# Patient Record
Sex: Female | Born: 1950 | Race: White | Hispanic: No | Marital: Single
Health system: Southern US, Community
[De-identification: ages and names within clinical notes are randomized; demographics above are authoritative.]

## PROBLEM LIST (undated history)

## (undated) DIAGNOSIS — E039 Hypothyroidism, unspecified: Secondary | ICD-10-CM

## (undated) DIAGNOSIS — H409 Unspecified glaucoma: Secondary | ICD-10-CM

## (undated) HISTORY — PX: HIP SURGERY: SHX245

---

## 2005-10-30 ENCOUNTER — Emergency Department: Payer: Self-pay | Admitting: Emergency Medicine

## 2019-06-04 ENCOUNTER — Other Ambulatory Visit: Payer: Self-pay

## 2019-06-04 ENCOUNTER — Emergency Department
Admission: EM | Admit: 2019-06-04 | Discharge: 2019-06-04 | Disposition: A | Discharge status: Home / Self Care | Payer: Medicare Other | Attending: Emergency Medicine | Admitting: Emergency Medicine

## 2019-06-04 ENCOUNTER — Emergency Department: Payer: Medicare Other

## 2019-06-04 DIAGNOSIS — S0083XA Contusion of other part of head, initial encounter: Secondary | ICD-10-CM | POA: Diagnosis not present

## 2019-06-04 DIAGNOSIS — S0181XA Laceration without foreign body of other part of head, initial encounter: Secondary | ICD-10-CM

## 2019-06-04 DIAGNOSIS — S01511A Laceration without foreign body of lip, initial encounter: Secondary | ICD-10-CM | POA: Diagnosis not present

## 2019-06-04 DIAGNOSIS — Y998 Other external cause status: Secondary | ICD-10-CM | POA: Diagnosis not present

## 2019-06-04 DIAGNOSIS — Y9301 Activity, walking, marching and hiking: Secondary | ICD-10-CM | POA: Insufficient documentation

## 2019-06-04 DIAGNOSIS — Y9289 Other specified places as the place of occurrence of the external cause: Secondary | ICD-10-CM | POA: Insufficient documentation

## 2019-06-04 DIAGNOSIS — E039 Hypothyroidism, unspecified: Secondary | ICD-10-CM | POA: Diagnosis not present

## 2019-06-04 DIAGNOSIS — W19XXXA Unspecified fall, initial encounter: Secondary | ICD-10-CM

## 2019-06-04 DIAGNOSIS — W01198A Fall on same level from slipping, tripping and stumbling with subsequent striking against other object, initial encounter: Secondary | ICD-10-CM | POA: Insufficient documentation

## 2019-06-04 DIAGNOSIS — S0990XA Unspecified injury of head, initial encounter: Secondary | ICD-10-CM | POA: Diagnosis present

## 2019-06-04 HISTORY — DX: Unspecified glaucoma: H40.9

## 2019-06-04 HISTORY — DX: Hypothyroidism, unspecified: E03.9

## 2019-06-04 MED ORDER — TRAMADOL HCL 50 MG PO TABS: 50.0000 mg | ORAL_TABLET | Freq: Four times a day (QID) | ORAL | 0 refills | Status: DC | PRN

## 2019-06-04 MED ORDER — LIDOCAINE-EPINEPHRINE (PF) 2 %-1:200000 IJ SOLN
10.0000 mL | Freq: Once | INTRAMUSCULAR | Status: AC
  Administered 2019-06-04: 10 mL
  Filled 2019-06-04 (×2): qty 10

## 2019-06-04 NOTE — ED Triage Notes (Signed)
Patient arrived via EMS. Patient is from home, AOx4 and ambulatory at baseline. Patient had mechanical fall hitting bottom of chin with a 2-3cm laceration and a 1cm laceration on the right/mid-center lip.   Patient did loose consciousness.  Patient is not on blood thinners.

## 2019-06-04 NOTE — ED Provider Notes (Signed)
[facility] Emergency Department Provider Note  ____________________________________________  Time seen: Approximately 1:22 PM  I have reviewed the triage vital signs and the nursing notes.   HISTORY  Chief Complaint Fall and Facial Laceration (Lip and Chin)    HPI Carly Carter is a 69 y.o. female who presents the emergency department complaining of headache, facial pain, lacerations to the lip and chin after a fall.  Patient states that she was wearing sandals, caught the sandals on an elevated concrete ridge.  This caused patient to fall forward striking her face and head.  She had a brief loss of consciousness after hitting her head.  She states that she is endorsing a small frontal headache, right-sided facial pain including the periorbital region, right maxillary region as well as lacerations to the right lower lip and right chin.  Patient has had no subsequent loss of consciousness.  EMS transports the patient and states that they have not given any kind of medications.  Patient currently denies any visual changes, neck pain, chest pain, shortness of breath, abdominal pain, nausea vomiting.  Patient denies any other musculoskeletal complaints.  No medications prior to arrival.         Past Medical History:  Diagnosis Date  . Glaucoma   . Hypothyroidism     There are no problems to display for this patient.   Past Surgical History:  Procedure Laterality Date  . HIP SURGERY Bilateral     Prior to Admission medications   Medication Sig Start Date End Date Taking? Authorizing Provider  traMADol (ULTRAM) 50 MG tablet Take 1 tablet (50 mg total) by mouth every 6 (six) hours as needed. 06/04/19   Deliliah Spranger, Delorise RoyalsJonathan D, PA-C    Allergies Radish [raphanus sativus] and Other  No family history on file.  Social History Social History   Tobacco Use  . Smoking status: Unknown If Ever Smoked  Substance Use Topics  . Alcohol use: Yes    Comment:  occasional glass of wine per week  . Drug use: Never     Review of Systems  Constitutional: No fever/chills Eyes: No visual changes. No discharge ENT: No upper respiratory complaints. Cardiovascular: no chest pain. Respiratory: no cough. No SOB. Gastrointestinal: No abdominal pain.  No nausea, no vomiting.  No diarrhea.  No constipation. Musculoskeletal: Right-sided facial pain Skin: Laceration to the right lower lip and right chin Neurological: Mild frontal headache but denies focal weakness or numbness. 10-point ROS otherwise negative.  ____________________________________________   PHYSICAL EXAM:  VITAL SIGNS: ED Triage Vitals  Enc Vitals Group     BP      Pulse      Resp      Temp      Temp src      SpO2      Weight      Height      Head Circumference      Peak Flow      Pain Score      Pain Loc      Pain Edu?      Excl. in GC?      Constitutional: Alert and oriented. Well appearing and in no acute distress. Eyes: Conjunctivae are normal. PERRL. EOMI. Head: Patient has ecchymosis, edema in the right periorbital region, right maxillary region.  Right lower lip laceration and right inferior chin laceration.  No gross deformities.  Patient with tenderness along the right inferior orbital rim, right maxillary region, right mandible.  No palpable abnormality  or crepitus.  No subcutaneous emphysema.  Patient has ecchymosis to the right periorbital region but not the left.  No battle signs.  No serosanguineous fluid drainage from the ears or nares. ENT:      Ears:       Nose: No congestion/rhinnorhea.      Mouth/Throat: Mucous membranes are moist.  Right lower lip laceration.  Patient has an visible line to upper and lower dentition.  No loose dentition.  No correct dentition.  No other signs of intraoral trauma. Neck: No stridor.  No cervical spine tenderness to palpation  Cardiovascular: Normal rate, regular rhythm. Normal S1 and S2.  Good peripheral  circulation. Respiratory: Normal respiratory effort without tachypnea or retractions. Lungs CTAB. Good air entry to the bases with no decreased or absent breath sounds. Musculoskeletal: Full range of motion to all extremities. No gross deformities appreciated. Neurologic:  Normal speech and language. No gross focal neurologic deficits are appreciated.  Cranial nerves II through XII grossly intact. Skin:  Skin is warm, dry and intact. No rash noted. Psychiatric: Mood and affect are normal. Speech and behavior are normal. Patient exhibits appropriate insight and judgement.   ____________________________________________   LABS (all labs ordered are listed, but only abnormal results are displayed)  Labs Reviewed - No data to display ____________________________________________  EKG   ____________________________________________  RADIOLOGY I personally viewed and evaluated these images as part of my medical decision making, as well as reviewing the written report by the radiologist.  CT Head Wo Contrast  Result Date: 06/04/2019 CLINICAL DATA:  Recent fall with facial pain and headaches, initial encounter EXAM: CT HEAD WITHOUT CONTRAST CT MAXILLOFACIAL WITHOUT CONTRAST CT CERVICAL SPINE WITHOUT CONTRAST TECHNIQUE: Multidetector CT imaging of the head, cervical spine, and maxillofacial structures were performed using the standard protocol without intravenous contrast. Multiplanar CT image reconstructions of the cervical spine and maxillofacial structures were also generated. COMPARISON:  None. FINDINGS: CT HEAD FINDINGS Brain: No evidence of acute infarction, hemorrhage, hydrocephalus, extra-axial collection or mass lesion/mass effect. Vascular: No hyperdense vessel or unexpected calcification. Skull: Normal. Negative for fracture or focal lesion. Other: None. CT MAXILLOFACIAL FINDINGS Osseous: No acute bony fracture is identified. Some periapical lucencies are noted in the left maxilla consistent  with progressive dental disease. Orbits: Orbits and their contents are unremarkable. Sinuses: Paranasal sinuses are well aerated with the exception of mucosal retention cyst in the inferior aspect of the left maxillary antrum. Soft tissues: Surrounding soft tissue structures demonstrate swelling in the right cheek just below the orbit. CT CERVICAL SPINE FINDINGS Alignment: Mild straightening of the normal cervical lordosis is noted likely related to muscular spasm. Skull base and vertebrae: 7 cervical segments are well visualized. Vertebral body height is well maintained. Mild facet hypertrophic changes are noted. No acute fracture or acute facet abnormality is noted. Partial fusion at C3-4 is noted in the posterior elements on the right. No acute fracture or acute facet abnormality is noted. Soft tissues and spinal canal: Surrounding soft tissue structures are within normal limits. Upper chest: Visualized upper lung fields are within normal limits. Other: None IMPRESSION: CT of the head: No acute intracranial abnormality noted. CT of the maxillofacial bones: No acute bony abnormality is noted. Mild soft tissue swelling in the right cheek consistent with the recent injury. Mucosal retention cyst in the left maxillary antrum. CT of cervical spine: Multilevel degenerative change without acute bony abnormality. Straightening of the normal cervical lordosis likely related to muscular spasm. Electronically Signed   By:  Alcide Clever M.D.   On: 06/04/2019 14:21   CT Cervical Spine Wo Contrast  Result Date: 06/04/2019 CLINICAL DATA:  Recent fall with facial pain and headaches, initial encounter EXAM: CT HEAD WITHOUT CONTRAST CT MAXILLOFACIAL WITHOUT CONTRAST CT CERVICAL SPINE WITHOUT CONTRAST TECHNIQUE: Multidetector CT imaging of the head, cervical spine, and maxillofacial structures were performed using the standard protocol without intravenous contrast. Multiplanar CT image reconstructions of the cervical spine and  maxillofacial structures were also generated. COMPARISON:  None. FINDINGS: CT HEAD FINDINGS Brain: No evidence of acute infarction, hemorrhage, hydrocephalus, extra-axial collection or mass lesion/mass effect. Vascular: No hyperdense vessel or unexpected calcification. Skull: Normal. Negative for fracture or focal lesion. Other: None. CT MAXILLOFACIAL FINDINGS Osseous: No acute bony fracture is identified. Some periapical lucencies are noted in the left maxilla consistent with progressive dental disease. Orbits: Orbits and their contents are unremarkable. Sinuses: Paranasal sinuses are well aerated with the exception of mucosal retention cyst in the inferior aspect of the left maxillary antrum. Soft tissues: Surrounding soft tissue structures demonstrate swelling in the right cheek just below the orbit. CT CERVICAL SPINE FINDINGS Alignment: Mild straightening of the normal cervical lordosis is noted likely related to muscular spasm. Skull base and vertebrae: 7 cervical segments are well visualized. Vertebral body height is well maintained. Mild facet hypertrophic changes are noted. No acute fracture or acute facet abnormality is noted. Partial fusion at C3-4 is noted in the posterior elements on the right. No acute fracture or acute facet abnormality is noted. Soft tissues and spinal canal: Surrounding soft tissue structures are within normal limits. Upper chest: Visualized upper lung fields are within normal limits. Other: None IMPRESSION: CT of the head: No acute intracranial abnormality noted. CT of the maxillofacial bones: No acute bony abnormality is noted. Mild soft tissue swelling in the right cheek consistent with the recent injury. Mucosal retention cyst in the left maxillary antrum. CT of cervical spine: Multilevel degenerative change without acute bony abnormality. Straightening of the normal cervical lordosis likely related to muscular spasm. Electronically Signed   By: Alcide Clever M.D.   On: 06/04/2019  14:21   CT Maxillofacial Wo Contrast  Result Date: 06/04/2019 CLINICAL DATA:  Recent fall with facial pain and headaches, initial encounter EXAM: CT HEAD WITHOUT CONTRAST CT MAXILLOFACIAL WITHOUT CONTRAST CT CERVICAL SPINE WITHOUT CONTRAST TECHNIQUE: Multidetector CT imaging of the head, cervical spine, and maxillofacial structures were performed using the standard protocol without intravenous contrast. Multiplanar CT image reconstructions of the cervical spine and maxillofacial structures were also generated. COMPARISON:  None. FINDINGS: CT HEAD FINDINGS Brain: No evidence of acute infarction, hemorrhage, hydrocephalus, extra-axial collection or mass lesion/mass effect. Vascular: No hyperdense vessel or unexpected calcification. Skull: Normal. Negative for fracture or focal lesion. Other: None. CT MAXILLOFACIAL FINDINGS Osseous: No acute bony fracture is identified. Some periapical lucencies are noted in the left maxilla consistent with progressive dental disease. Orbits: Orbits and their contents are unremarkable. Sinuses: Paranasal sinuses are well aerated with the exception of mucosal retention cyst in the inferior aspect of the left maxillary antrum. Soft tissues: Surrounding soft tissue structures demonstrate swelling in the right cheek just below the orbit. CT CERVICAL SPINE FINDINGS Alignment: Mild straightening of the normal cervical lordosis is noted likely related to muscular spasm. Skull base and vertebrae: 7 cervical segments are well visualized. Vertebral body height is well maintained. Mild facet hypertrophic changes are noted. No acute fracture or acute facet abnormality is noted. Partial fusion at C3-4 is noted in  the posterior elements on the right. No acute fracture or acute facet abnormality is noted. Soft tissues and spinal canal: Surrounding soft tissue structures are within normal limits. Upper chest: Visualized upper lung fields are within normal limits. Other: None IMPRESSION: CT of the  head: No acute intracranial abnormality noted. CT of the maxillofacial bones: No acute bony abnormality is noted. Mild soft tissue swelling in the right cheek consistent with the recent injury. Mucosal retention cyst in the left maxillary antrum. CT of cervical spine: Multilevel degenerative change without acute bony abnormality. Straightening of the normal cervical lordosis likely related to muscular spasm. Electronically Signed   By: Alcide Clever M.D.   On: 06/04/2019 14:21    ____________________________________________    PROCEDURES  Procedure(s) performed:    Marland KitchenMarland KitchenLaceration Repair  Date/Time: 06/04/2019 2:41 PM Performed by: Racheal Patches, PA-C Authorized by: Racheal Patches, PA-C   Consent:    Consent obtained:  Verbal   Consent given by:  Patient   Risks discussed:  Pain Anesthesia (see MAR for exact dosages):    Anesthesia method:  Local infiltration   Local anesthetic:  Lidocaine 2% WITH epi Laceration details:    Location:  Lip   Lip location:  Lower interior lip   Length (cm):  1.5 Repair type:    Repair type:  Simple Exploration:    Hemostasis achieved with:  Epinephrine   Wound extent: no foreign bodies/material noted and no underlying fracture noted     Contaminated: no   Treatment:    Area cleansed with:  Betadine and saline   Amount of cleaning:  Standard   Irrigation solution:  Sterile saline Skin repair:    Repair method:  Sutures   Suture size:  5-0   Wound skin closure material used: monocryl.   Suture technique:  Simple interrupted   Number of sutures:  4 Approximation:    Approximation:  Close Post-procedure details:    Dressing:  Open (no dressing)   Patient tolerance of procedure:  Tolerated well, no immediate complications  .Marland KitchenLaceration Repair  Date/Time: 06/04/2019 2:44 PM Performed by: Racheal Patches, PA-C Authorized by: Racheal Patches, PA-C   Consent:    Consent obtained:  Verbal   Consent given by:   Patient   Risks discussed:  Pain and poor cosmetic result   Alternatives discussed:  No treatment Anesthesia (see MAR for exact dosages):    Anesthesia method:  Local infiltration   Local anesthetic:  Lidocaine 2% WITH epi Laceration details:    Location:  Face   Face location:  Chin   Length (cm):  3 Repair type:    Repair type:  Simple Pre-procedure details:    Preparation:  Patient was prepped and draped in usual sterile fashion and imaging obtained to evaluate for foreign bodies Exploration:    Hemostasis achieved with:  Direct pressure and epinephrine   Wound exploration: wound explored through full range of motion and entire depth of wound probed and visualized     Wound extent: no foreign bodies/material noted, no muscle damage noted, no nerve damage noted and no underlying fracture noted     Contaminated: no   Treatment:    Area cleansed with:  Betadine and saline   Amount of cleaning:  Standard   Irrigation solution:  Sterile saline Skin repair:    Repair method:  Sutures   Suture size:  6-0   Suture material:  Nylon   Suture technique:  Running locked   Number of  sutures:  1 (1 running interlock suture with 10 throws) Approximation:    Approximation:  Close Post-procedure details:    Dressing:  Open (no dressing)   Patient tolerance of procedure:  Tolerated well, no immediate complications      Medications  lidocaine-EPINEPHrine (XYLOCAINE W/EPI) 2 %-1:200000 (PF) injection 10 mL (10 mLs Infiltration Given by Other 06/04/19 1339)     ____________________________________________   INITIAL IMPRESSION / ASSESSMENT AND PLAN / ED COURSE  Pertinent labs & imaging results that were available during my care of the patient were reviewed by me and considered in my medical decision making (see chart for details).  Review of the [place] CSRS was performed in accordance of the [place] prior to dispensing any controlled drugs.           Patient's diagnosis is consistent  with fall, facial contusion, lip laceration, chin laceration.  Patient presented to emergency department after mechanical fall.  Patient fell striking her head.  She did briefly lose consciousness.  Patient neuro exam was reassuring here in the emergency department.  Patient had lacerations to the lip and chin which are closed as described above.  Patient tolerated well.  Imaging reveals significant soft tissue swelling which is consistent with physical exam with no underlying fractures of the face.  No intracranial hemorrhage..  Patient is stable for discharge at this time and no indication for further work-up.  Follow-up with primary care in 1 week for suture removal of the chin.  Patient is given ED precautions to return to the ED for any worsening or new symptoms.     ____________________________________________  FINAL CLINICAL IMPRESSION(S) / ED DIAGNOSES  Final diagnoses:  Fall, initial encounter  Contusion of face, initial encounter  Lip laceration, initial encounter  Chin laceration, initial encounter      NEW MEDICATIONS STARTED DURING THIS VISIT:  ED Discharge Orders         Ordered    traMADol (ULTRAM) 50 MG tablet  Every 6 hours PRN     06/04/19 1453              This chart was dictated using voice recognition software/Dragon. Despite best efforts to proofread, errors can occur which can change the meaning. Any change was purely unintentional.    Darletta Moll, PA-C 06/04/19 1501    Lavonia Drafts, MD 06/04/19 [phone]

## 2021-10-24 IMAGING — CT CT HEAD W/O CM
3 series · 14 of 47 positions shown, 16 images · non-contrast
Comparison: None.

CLINICAL DATA: Recent fall with facial pain and headaches, initial
encounter

EXAM:
CT HEAD WITHOUT CONTRAST
CT MAXILLOFACIAL WITHOUT CONTRAST
CT CERVICAL SPINE WITHOUT CONTRAST
TECHNIQUE: Multidetector CT imaging of the head, cervical spine, and
maxillofacial structures were performed using the standard protocol
without intravenous contrast. Multiplanar CT image reconstructions
of the cervical spine and maxillofacial structures were also
generated.

[Series 2: head wo · axial · 0.47mm/px · z∈[-55,+70]mm · 8 of 31 slices shown, 10 images]
[im 3/31  brain]
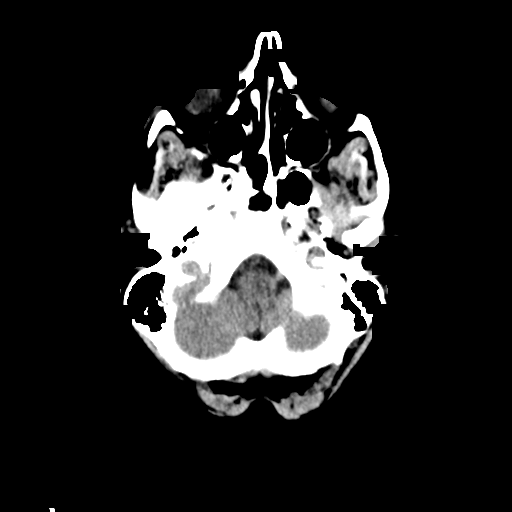
[im 3/31  bone]
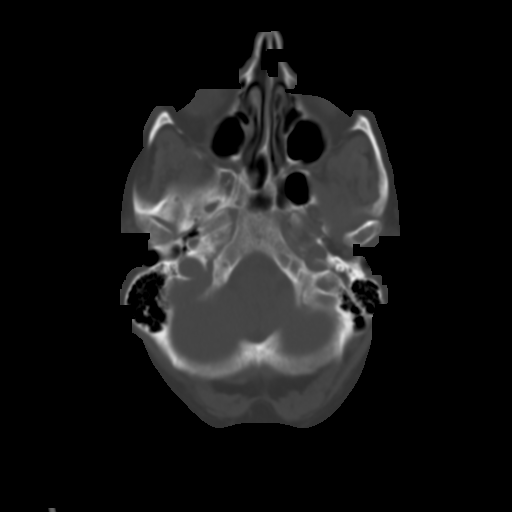
[im 7/31  brain]
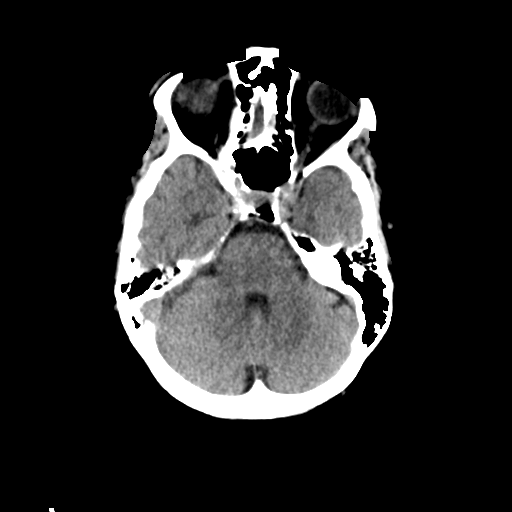
[im 10/31  brain]
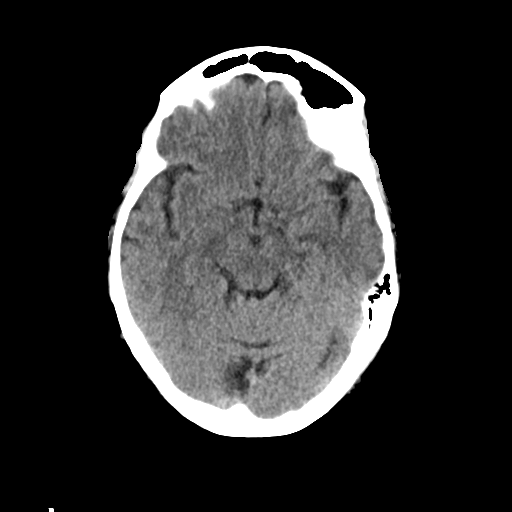
[im 14/31  brain]
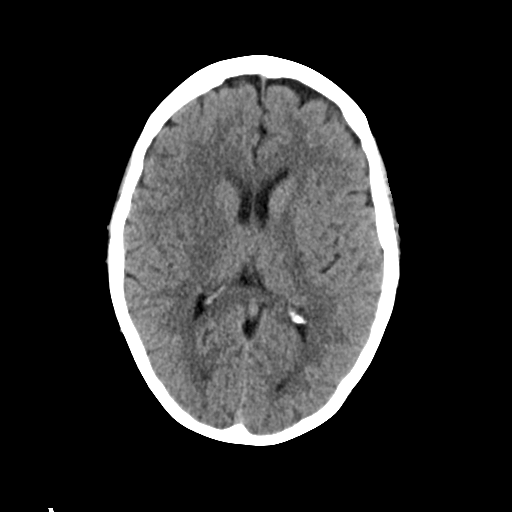
[im 17/31  brain]
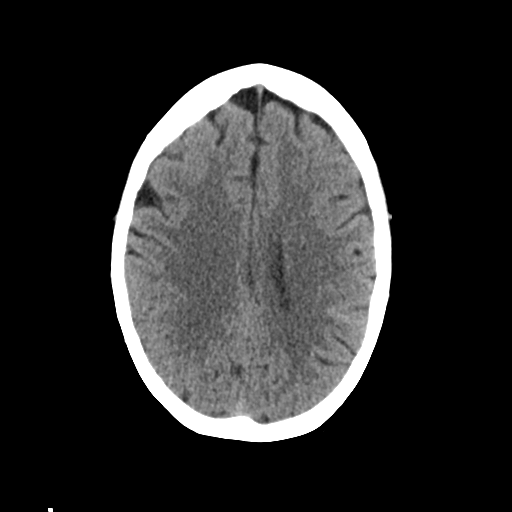
[im 17/31  bone]
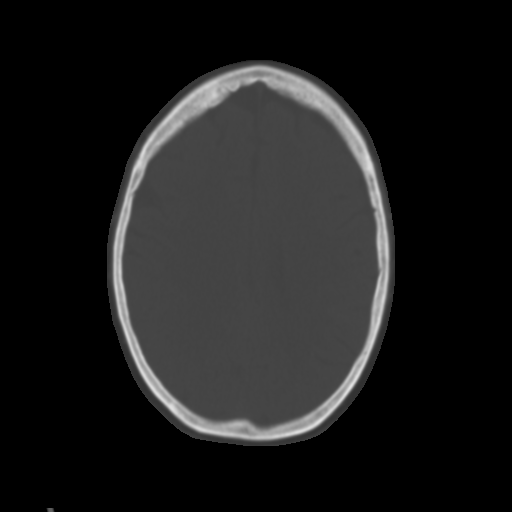
[im 21/31  brain]
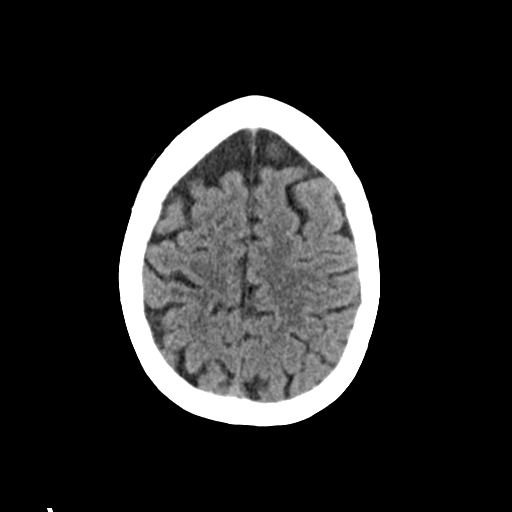
[im 24/31  brain]
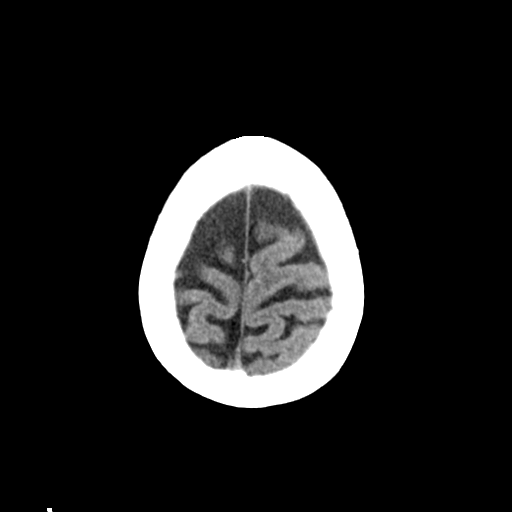
[im 28/31  brain]
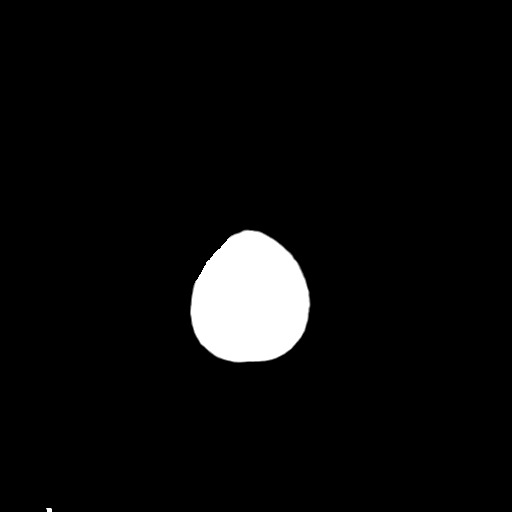

[Series 4: coronal soft tissue · coronal · 0.29mm/px · 3 of 67 slices shown]
[im 23/67  brain]
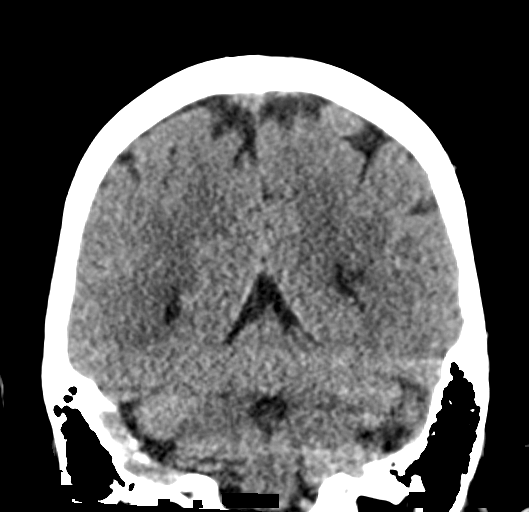
[im 30/67  brain]
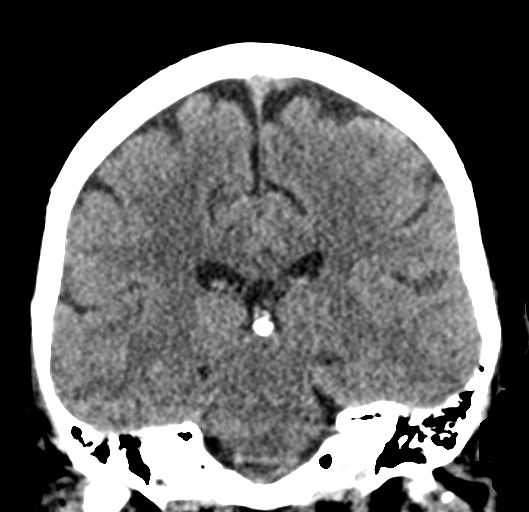
[im 37/67  brain]
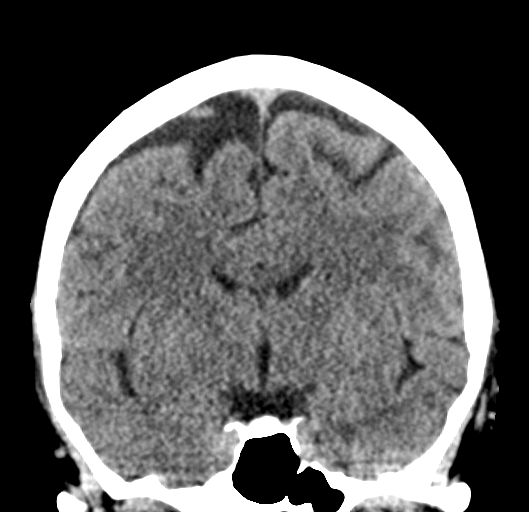

[Series 5: sagittal soft tissue · sagittal · 0.29mm/px · 3 of 52 slices shown]
[im 18/52  brain]
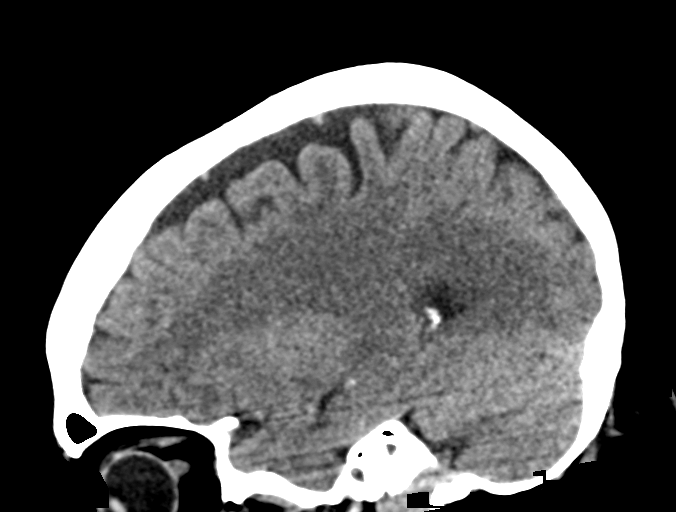
[im 26/52  brain]
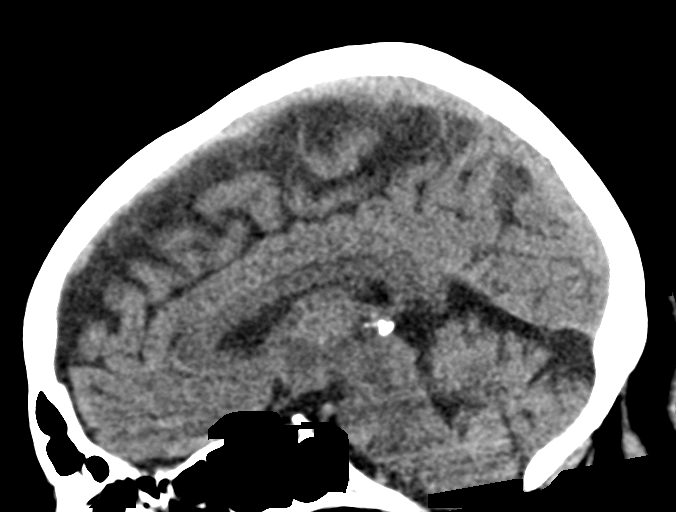
[im 35/52  brain]
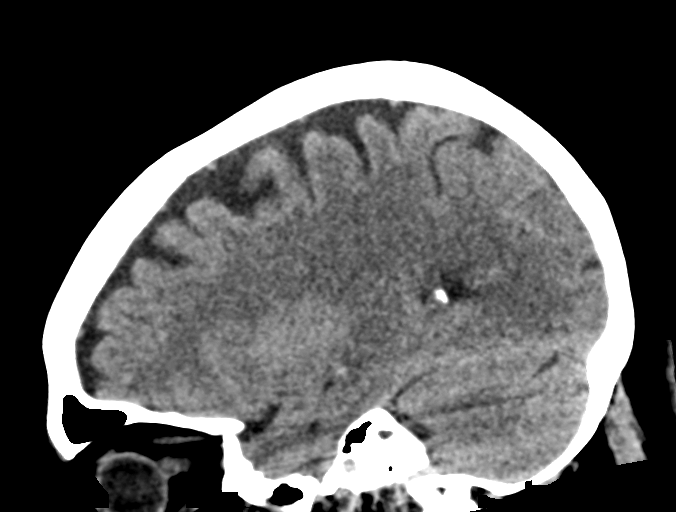

[14 of 47 positions shown; findings below may reference images not displayed]

FINDINGS: CT HEAD FINDINGS

Brain: No evidence of acute infarction, hemorrhage, hydrocephalus,
extra-axial collection or mass lesion/mass effect.

Vascular: No hyperdense vessel or unexpected calcification.

Skull: Normal. Negative for fracture or focal lesion.

Other: None.

CT MAXILLOFACIAL FINDINGS

Osseous: No acute bony fracture is identified. Some periapical
lucencies are noted in the left maxilla consistent with progressive
dental disease.

Orbits: Orbits and their contents are unremarkable.

Sinuses: Paranasal sinuses are well aerated with the exception of
mucosal retention cyst in the inferior aspect of the left maxillary
antrum.

Soft tissues: Surrounding soft tissue structures demonstrate
swelling in the right cheek just below the orbit.

CT CERVICAL SPINE FINDINGS

Alignment: Mild straightening of the normal cervical lordosis is
noted likely related to muscular spasm.

Skull base and vertebrae: 7 cervical segments are well visualized.
Vertebral body height is well maintained. Mild facet hypertrophic
changes are noted. No acute fracture or acute facet abnormality is
noted. Partial fusion at C3-4 is noted in the posterior elements on
the right. No acute fracture or acute facet abnormality is noted.

Soft tissues and spinal canal: Surrounding soft tissue structures
are within normal limits.

Upper chest: Visualized upper lung fields are within normal limits.

Other: None
IMPRESSION: CT of the head: No acute intracranial abnormality noted.

CT of the maxillofacial bones: No acute bony abnormality is noted.

Mild soft tissue swelling in the right cheek consistent with the
recent injury.

Mucosal retention cyst in the left maxillary antrum.

CT of cervical spine: Multilevel degenerative change without acute
bony abnormality.

Straightening of the normal cervical lordosis likely related to
muscular spasm.

## 2021-10-24 IMAGING — CT CT MAXILLOFACIAL W/O CM
3 series · 14 of 47 positions shown, 16 images · non-contrast
Comparison: None.

CLINICAL DATA: Recent fall with facial pain and headaches, initial
encounter

EXAM:
CT HEAD WITHOUT CONTRAST
CT MAXILLOFACIAL WITHOUT CONTRAST
CT CERVICAL SPINE WITHOUT CONTRAST
TECHNIQUE: Multidetector CT imaging of the head, cervical spine, and
maxillofacial structures were performed using the standard protocol
without intravenous contrast. Multiplanar CT image reconstructions
of the cervical spine and maxillofacial structures were also
generated.

[Series 3: max soft · axial · 0.34mm/px · z∈[-158,-16]mm · 8 of 83 slices shown, 10 images]
[im 6/83  brain]
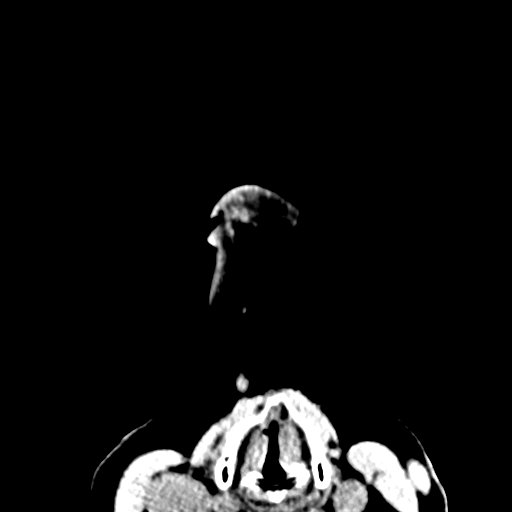
[im 6/83  bone]
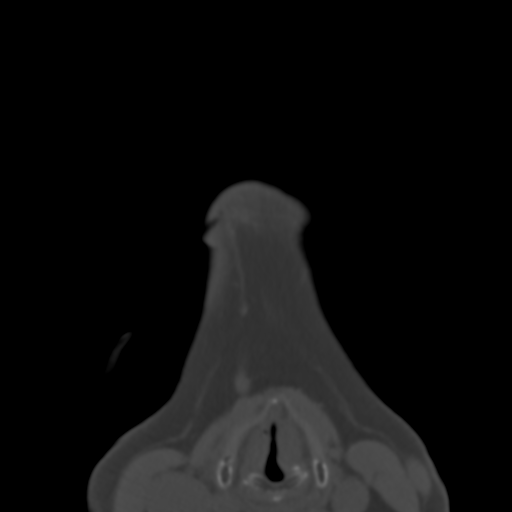
[im 17/83  bone]
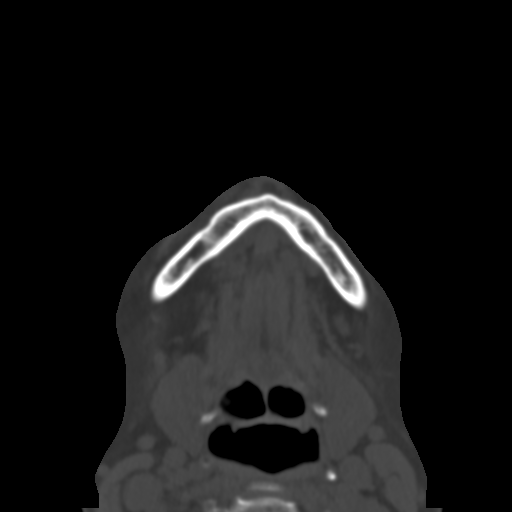
[im 26/83  bone]
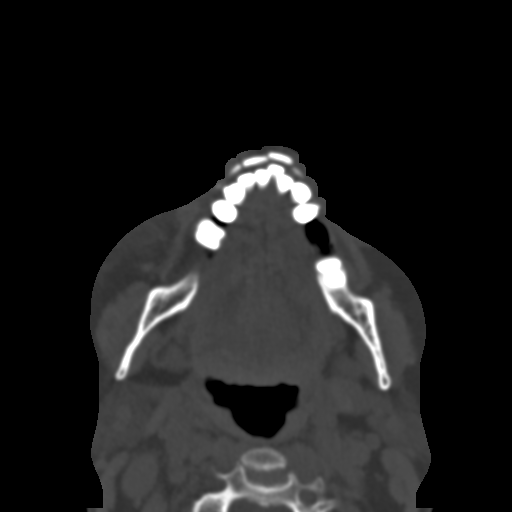
[im 37/83  bone]
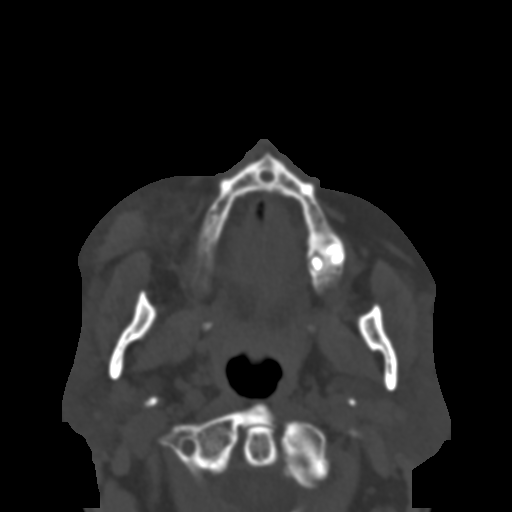
[im 46/83  brain]
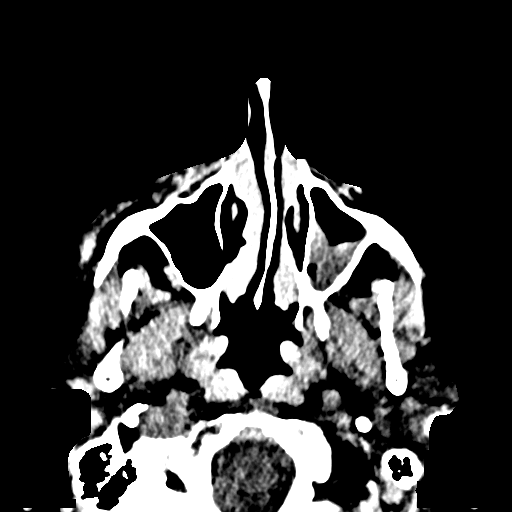
[im 46/83  bone]
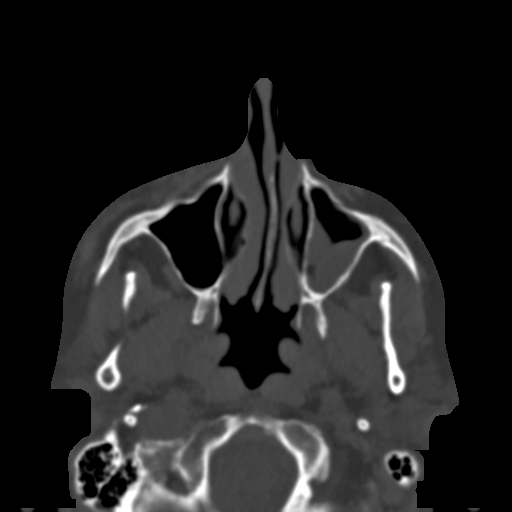
[im 57/83  bone]
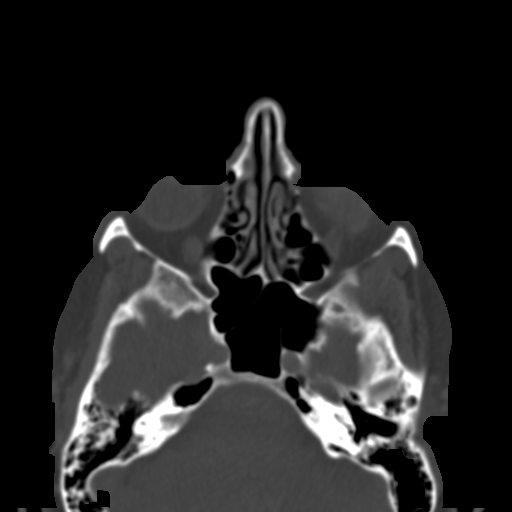
[im 66/83  bone]
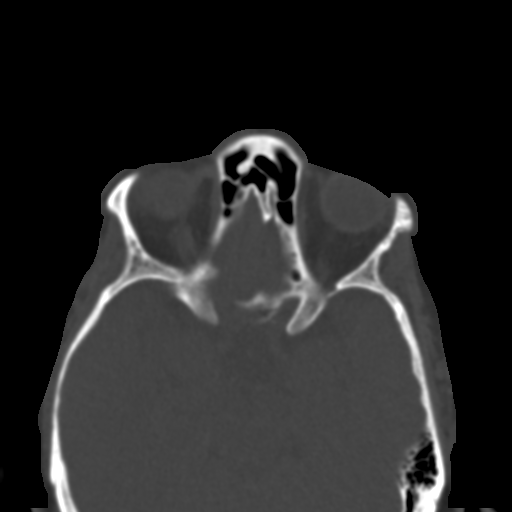
[im 77/83  bone]
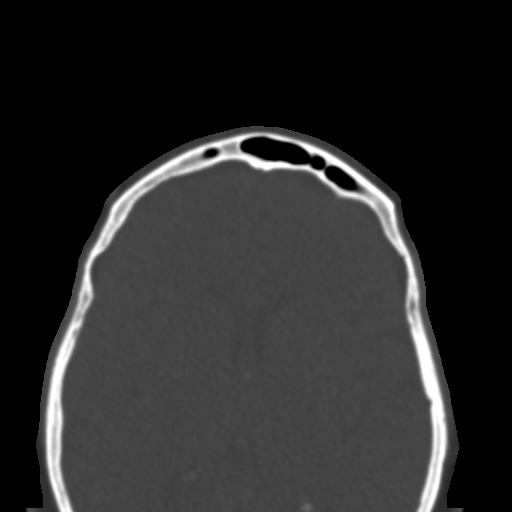

[Series 4: coronal soft · coronal · 0.32mm/px · 3 of 62 slices shown]
[im 21/62  bone]
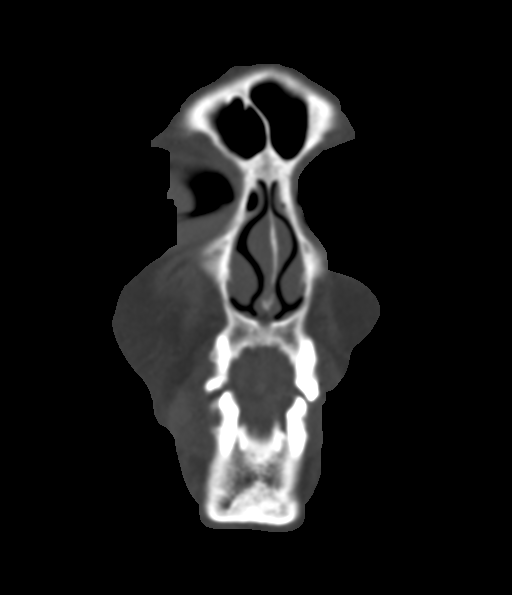
[im 28/62  bone]
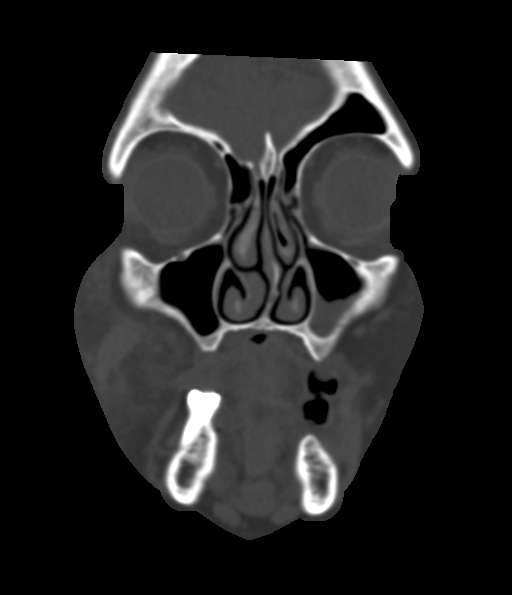
[im 34/62  bone]
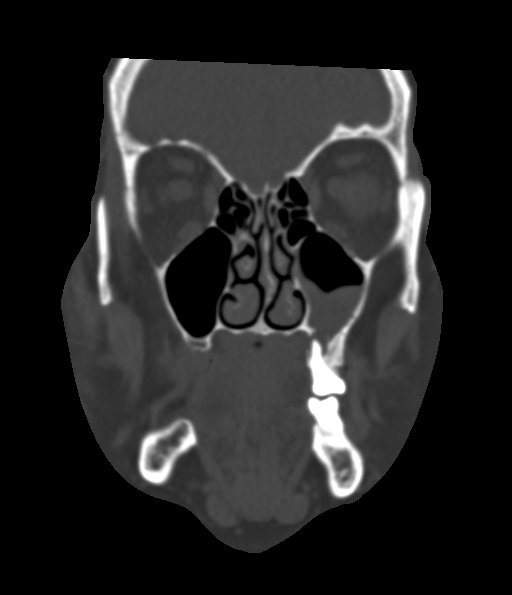

[Series 5: sagittal soft · sagittal · 0.24mm/px · 3 of 84 slices shown]
[im 28/84  bone]
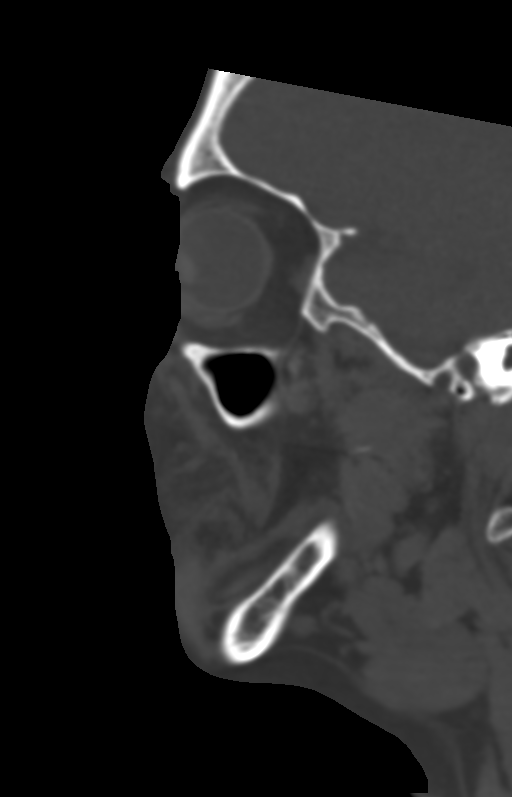
[im 42/84  bone]
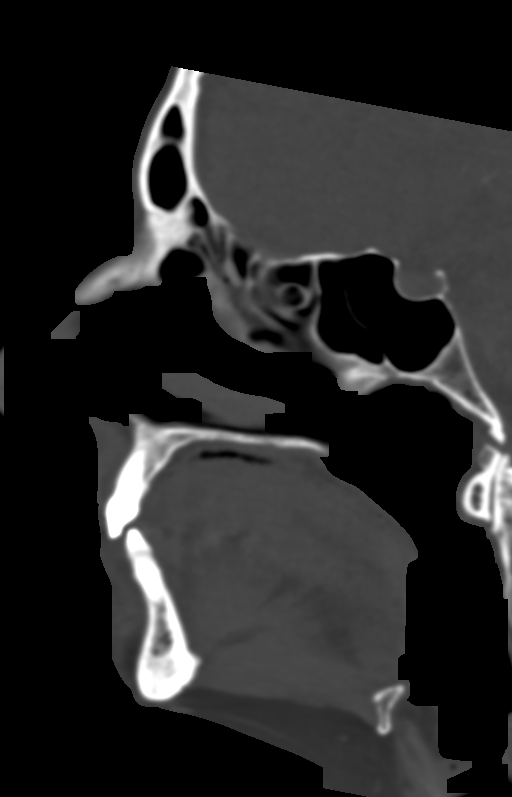
[im 56/84  bone]
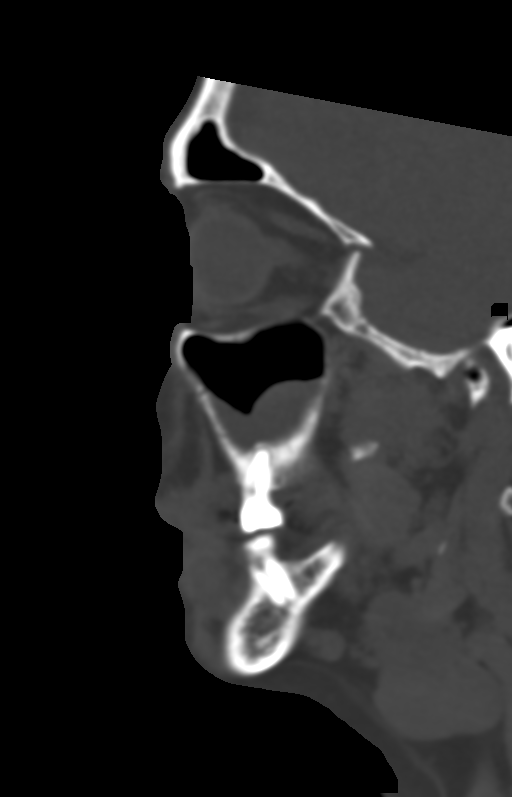

[14 of 47 positions shown; findings below may reference images not displayed]

FINDINGS: CT HEAD FINDINGS

Brain: No evidence of acute infarction, hemorrhage, hydrocephalus,
extra-axial collection or mass lesion/mass effect.

Vascular: No hyperdense vessel or unexpected calcification.

Skull: Normal. Negative for fracture or focal lesion.

Other: None.

CT MAXILLOFACIAL FINDINGS

Osseous: No acute bony fracture is identified. Some periapical
lucencies are noted in the left maxilla consistent with progressive
dental disease.

Orbits: Orbits and their contents are unremarkable.

Sinuses: Paranasal sinuses are well aerated with the exception of
mucosal retention cyst in the inferior aspect of the left maxillary
antrum.

Soft tissues: Surrounding soft tissue structures demonstrate
swelling in the right cheek just below the orbit.

CT CERVICAL SPINE FINDINGS

Alignment: Mild straightening of the normal cervical lordosis is
noted likely related to muscular spasm.

Skull base and vertebrae: 7 cervical segments are well visualized.
Vertebral body height is well maintained. Mild facet hypertrophic
changes are noted. No acute fracture or acute facet abnormality is
noted. Partial fusion at C3-4 is noted in the posterior elements on
the right. No acute fracture or acute facet abnormality is noted.

Soft tissues and spinal canal: Surrounding soft tissue structures
are within normal limits.

Upper chest: Visualized upper lung fields are within normal limits.

Other: None
IMPRESSION: CT of the head: No acute intracranial abnormality noted.

CT of the maxillofacial bones: No acute bony abnormality is noted.

Mild soft tissue swelling in the right cheek consistent with the
recent injury.

Mucosal retention cyst in the left maxillary antrum.

CT of cervical spine: Multilevel degenerative change without acute
bony abnormality.

Straightening of the normal cervical lordosis likely related to
muscular spasm.

## 2023-08-14 ENCOUNTER — Ambulatory Visit (INDEPENDENT_AMBULATORY_CARE_PROVIDER_SITE_OTHER)

## 2023-08-14 ENCOUNTER — Ambulatory Visit: Admission: EM | Admit: 2023-08-14 | Discharge: 2023-08-14 | Disposition: A | Discharge status: Home / Self Care

## 2023-08-14 ENCOUNTER — Encounter: Payer: Self-pay | Admitting: Emergency Medicine

## 2023-08-14 DIAGNOSIS — J069 Acute upper respiratory infection, unspecified: Secondary | ICD-10-CM | POA: Diagnosis not present

## 2023-08-14 DIAGNOSIS — R0602 Shortness of breath: Secondary | ICD-10-CM

## 2023-08-14 MED ORDER — ALBUTEROL SULFATE HFA 108 (90 BASE) MCG/ACT IN AERS: 2.0000 | INHALATION_SPRAY | RESPIRATORY_TRACT | 0 refills | Status: AC | PRN

## 2023-08-14 MED ORDER — IPRATROPIUM BROMIDE 0.06 % NA SOLN
2.0000 | Freq: Four times a day (QID) | NASAL | 12 refills | Status: AC
Start: 2023-08-14 — End: ?

## 2023-08-14 MED ORDER — AEROCHAMBER MV MISC: 2 refills | Status: AC

## 2023-08-14 MED ORDER — BENZONATATE 100 MG PO CAPS: 200.0000 mg | ORAL_CAPSULE | Freq: Three times a day (TID) | ORAL | 0 refills | Status: DC

## 2023-08-14 MED ORDER — CEFDINIR 300 MG PO CAPS: 300.0000 mg | ORAL_CAPSULE | Freq: Two times a day (BID) | ORAL | 0 refills | Status: AC

## 2023-08-14 MED ORDER — PROMETHAZINE-DM 6.25-15 MG/5ML PO SYRP: 5.0000 mL | ORAL_SOLUTION | Freq: Four times a day (QID) | ORAL | 0 refills | Status: DC | PRN

## 2023-08-14 NOTE — Discharge Instructions (Addendum)
 Your chest x-ray did not show any evidence of pneumonia.  Your exam is consistent with an upper respiratory tract infection.  Given that you have had symptoms for 2 months I do feel a trial of antibiotics is warranted.  Take the cefdinir twice daily with food for 7 days for treatment of your upper respiratory tract infection.  Use the albuterol inhaler, with the spacer, and take 1 to 2 puffs every 4-6 hours as needed for shortness of breath or wheezing.  You may use over-the-counter Tylenol and/or ibuprofen according the package directions as needed for any pain.  Use the Atrovent nasal spray, 2 squirts in each nostril every 6 hours, as needed for runny nose and postnasal drip.  Use the Tessalon Perles every 8 hours during the day.  Take them with a small sip of water.  They may give you some numbness to the base of your tongue or a metallic taste in your mouth, this is normal.  Use the Promethazine DM cough syrup at bedtime for cough and congestion.  It will make you drowsy so do not take it during the day.  Return for reevaluation or see your primary care provider for any new or worsening symptoms.

## 2023-08-14 NOTE — ED Provider Notes (Addendum)
 [facility]    CSN: [id number] Arrival date & time: 08/14/23  1155      History   Chief Complaint Chief Complaint  Patient presents with   Shortness of Breath    HPI Carly Carter is a 73 y.o. female.   HPI  73 year old female with past medical history significant for glaucoma and hypothyroidism presents for evaluation of 2 months worth of respiratory complaints.  These include 2 months worth of shortness of breath with 1 month worth of wheezing.  No cough.  She has been experiencing sinus pressure with clear nasal discharge for the last 2 months.  She woke up last night with a frontal sinus headache that has now resolved.  She is also felt some fullness in her left ear.  No fever, sore throat, or cough.  Past Medical History:  Diagnosis Date   Glaucoma    Hypothyroidism     There are no active problems to display for this patient.   Past Surgical History:  Procedure Laterality Date   HIP SURGERY Bilateral     OB History   No obstetric history on file.      Home Medications    Prior to Admission medications   Medication Sig Start Date End Date Taking? Authorizing Provider  albuterol (VENTOLIN HFA) 108 (90 Base) MCG/ACT inhaler Inhale 2 puffs into the lungs every 4 (four) hours as needed. 08/14/23  Yes Bernardino Ditch, NP  benzonatate (TESSALON) 100 MG capsule Take 2 capsules (200 mg total) by mouth every 8 (eight) hours. 08/14/23  Yes Bernardino Ditch, NP  cefdinir (OMNICEF) 300 MG capsule Take 1 capsule (300 mg total) by mouth 2 (two) times daily for 7 days. 08/14/23 08/21/23 Yes Bernardino Ditch, NP  ipratropium (ATROVENT) 0.06 % nasal spray Place 2 sprays into both nostrils 4 (four) times daily. 08/14/23  Yes Bernardino Ditch, NP  levothyroxine (SYNTHROID) 100 MCG tablet Take 100 mcg by mouth daily. 06/04/23  Yes [provider]  promethazine-dextromethorphan (PROMETHAZINE-DM) 6.25-15 MG/5ML syrup Take 5 mLs by mouth 4 (four) times daily as needed. 08/14/23  Yes  Bernardino Ditch, NP  Spacer/Aero-Holding Raguel (AEROCHAMBER MV) inhaler Use as instructed 08/14/23  Yes Bernardino Ditch, NP  traMADol  (ULTRAM ) 50 MG tablet Take 1 tablet (50 mg total) by mouth every 6 (six) hours as needed. 06/04/19   Cuthriell, Dorn BIRCH, PA-C    Family History History reviewed. No pertinent family history.  Social History Social History   Tobacco Use   Smoking status: Unknown  Substance Use Topics   Alcohol use: Yes    Comment: occasional glass of wine per week   Drug use: Never     Allergies   Radish [radish (raphanus sativus)], Wasp venom, Other, and Indomethacin   Review of Systems Review of Systems  Constitutional:  Negative for fever.  HENT:  Positive for congestion, ear pain, rhinorrhea and sinus pressure. Negative for sore throat.   Respiratory:  Positive for shortness of breath and wheezing. Negative for cough.      Physical Exam Triage Vital Signs ED Triage Vitals  Encounter Vitals Group     BP      Girls Systolic BP Percentile      Girls Diastolic BP Percentile      Boys Systolic BP Percentile      Boys Diastolic BP Percentile      Pulse      Resp      Temp      Temp src  SpO2      Weight      Height      Head Circumference      Peak Flow      Pain Score      Pain Loc      Pain Education      Exclude from Growth Chart    No data found.  Updated Vital Signs BP (!) 143/94 (BP Location: Left Arm)   Pulse 76   Temp 98.4 F (36.9 C) (Oral)   Resp 20   SpO2 100%   Visual Acuity Right Eye Distance:   Left Eye Distance:   Bilateral Distance:    Right Eye Near:   Left Eye Near:    Bilateral Near:     Physical Exam Vitals and nursing note reviewed.  Constitutional:      Appearance: Normal appearance. She is not ill-appearing.  HENT:     Head: Normocephalic and atraumatic.     Right Ear: Tympanic membrane, ear canal and external ear normal. There is no impacted cerumen.     Left Ear: Tympanic membrane, ear canal and  external ear normal. There is no impacted cerumen.     Nose: Congestion and rhinorrhea present.     Comments: Nasal mucosa is mildly edematous and erythematous.  Clear discharge in both nares to a scant degree.    Mouth/Throat:     Mouth: Mucous membranes are moist.     Pharynx: Oropharynx is clear. No oropharyngeal exudate or posterior oropharyngeal erythema.  Cardiovascular:     Rate and Rhythm: Normal rate and regular rhythm.     Pulses: Normal pulses.     Heart sounds: Normal heart sounds. No murmur heard.    No friction rub. No gallop.  Pulmonary:     Effort: Pulmonary effort is normal.     Breath sounds: Normal breath sounds. No wheezing, rhonchi or rales.  Musculoskeletal:     Cervical back: Normal range of motion and neck supple. No tenderness.  Lymphadenopathy:     Cervical: No cervical adenopathy.  Skin:    General: Skin is warm and dry.     Capillary Refill: Capillary refill takes less than 2 seconds.     Findings: No rash.  Neurological:     General: No focal deficit present.     Mental Status: She is alert and oriented to person, place, and time.      UC Treatments / Results  Labs (all labs ordered are listed, but only abnormal results are displayed) Labs Reviewed - No data to display  EKG   Radiology No results found.  Procedures Procedures (including critical care time)  Medications Ordered in UC Medications - No data to display  Initial Impression / Assessment and Plan / UC Course  I have reviewed the triage vital signs and the nursing notes.  Pertinent labs & imaging results that were available during my care of the patient were reviewed by me and considered in my medical decision making (see chart for details).   Patient is a pleasant, nontoxic-appearing 73 year old female presenting for evaluation of 2 months with a respiratory complaint as outlined HPI above.  She is reporting shortness of breath and wheezing, though in the exam room she is able  to speak in full sentences without dyspnea or tachypnea.  Respiratory rate is 20 but her room air oxygen saturation is 100%.  She is afebrile with oral temp of 98.4.  On exam she does have inflammation of her nasal mucosa  with scant clear discharge in both nares.  The remainder of her upper respiratory tract is benign.  Cardiopulmonary exam reveals S1-S2 heart sounds with regular rate and rhythm and lungs that are clear to auscultation in all fields.  Patient has normal chest excursion.  Given that she is complaining of shortness of the and wheezing that has been going on for last 2 months.  The chest x-ray is warranted to evaluate for any acute cardiopulmonary pathology.  Chest x-ray independently reviewed and evaluated by me.  Impression: Lungs are well aerated without evidence of infiltrate or effusion.  Cardiomediastinal silhouette appears normal.  Radiology overread is pending. Radiology impression states no active cardiopulmonary disease.  I will discharge patient on the diagnosis of URI with cough and congestion.  I will start her on cefdinir 300 mg twice daily x 7 days.  I will also prescribe Atrovent nasal spray that she can use every 6 hours to help with the nasal congestion.  Tessalon Perles and Promethazine DM cough syrup for cough and congestion.  Additionally, I will prescribe an albuterol inhaler and spacer and she can do 1 to 2 puffs every 4-6 hours as needed for shortness breath or wheezing.  Return precautions reviewed.   Final Clinical Impressions(s) / UC Diagnoses   Final diagnoses:  Shortness of breath  URI with cough and congestion     Discharge Instructions      Your chest x-ray did not show any evidence of pneumonia.  Your exam is consistent with an upper respiratory tract infection.  Given that you have had symptoms for 2 months I do feel a trial of antibiotics is warranted.  Take the cefdinir twice daily with food for 7 days for treatment of your upper respiratory tract  infection.  Use the albuterol inhaler, with the spacer, and take 1 to 2 puffs every 4-6 hours as needed for shortness of breath or wheezing.  You may use over-the-counter Tylenol and/or ibuprofen according the package directions as needed for any pain.  Use the Atrovent nasal spray, 2 squirts in each nostril every 6 hours, as needed for runny nose and postnasal drip.  Use the Tessalon Perles every 8 hours during the day.  Take them with a small sip of water.  They may give you some numbness to the base of your tongue or a metallic taste in your mouth, this is normal.  Use the Promethazine DM cough syrup at bedtime for cough and congestion.  It will make you drowsy so do not take it during the day.  Return for reevaluation or see your primary care provider for any new or worsening symptoms.      ED Prescriptions     Medication Sig Dispense Auth. Provider   Spacer/Aero-Holding Chambers (AEROCHAMBER MV) inhaler Use as instructed 1 each Bernardino Ditch, NP   albuterol (VENTOLIN HFA) 108 (90 Base) MCG/ACT inhaler Inhale 2 puffs into the lungs every 4 (four) hours as needed. 18 g Bernardino Ditch, NP   benzonatate (TESSALON) 100 MG capsule Take 2 capsules (200 mg total) by mouth every 8 (eight) hours. 21 capsule Bernardino Ditch, NP   cefdinir (OMNICEF) 300 MG capsule Take 1 capsule (300 mg total) by mouth 2 (two) times daily for 7 days. 14 capsule Bernardino Ditch, NP   ipratropium (ATROVENT) 0.06 % nasal spray Place 2 sprays into both nostrils 4 (four) times daily. 15 mL Bernardino Ditch, NP   promethazine-dextromethorphan (PROMETHAZINE-DM) 6.25-15 MG/5ML syrup Take 5 mLs by mouth 4 (four) times  daily as needed. 118 mL Bernardino Ditch, NP      PDMP not reviewed this encounter.   Bernardino Ditch, NP 08/14/23 1310    Bernardino Ditch, NP 08/14/23 1322

## 2023-08-14 NOTE — ED Triage Notes (Signed)
 SOB x 2 months Wheezing 1 moth Sinus pressure and drainage that's clear.

## 2023-08-25 ENCOUNTER — Ambulatory Visit (INDEPENDENT_AMBULATORY_CARE_PROVIDER_SITE_OTHER)

## 2023-08-25 ENCOUNTER — Ambulatory Visit
Admission: EM | Admit: 2023-08-25 | Discharge: 2023-08-25 | Disposition: A | Discharge status: Home / Self Care | Attending: Emergency Medicine | Admitting: Emergency Medicine

## 2023-08-25 ENCOUNTER — Ambulatory Visit: Payer: Self-pay | Admitting: Emergency Medicine

## 2023-08-25 ENCOUNTER — Telehealth: Payer: Self-pay | Admitting: Pulmonary Disease

## 2023-08-25 DIAGNOSIS — R0602 Shortness of breath: Secondary | ICD-10-CM

## 2023-08-25 DIAGNOSIS — J0110 Acute frontal sinusitis, unspecified: Secondary | ICD-10-CM

## 2023-08-25 LAB — BASIC METABOLIC PANEL WITH GFR
Anion gap: 11 (ref 5–15)
BUN: 14 mg/dL (ref 8–23)
CO2: 21 mmol/L — ABNORMAL LOW (ref 22–32)
Calcium: 8.8 mg/dL — ABNORMAL LOW (ref 8.9–10.3)
Chloride: 103 mmol/L (ref 98–111)
Creatinine, Ser: 0.82 mg/dL (ref 0.44–1.00)
GFR, Estimated: >60 mL/min (ref >60)
Glucose, Bld: 108 mg/dL — ABNORMAL HIGH (ref 70–99)
Potassium: 4.3 mmol/L (ref 3.5–5.1)
Sodium: 135 mmol/L (ref 135–145)

## 2023-08-25 MED ORDER — PREDNISONE 20 MG PO TABS: 40.0000 mg | ORAL_TABLET | Freq: Every day | ORAL | 0 refills | Status: AC

## 2023-08-25 MED ORDER — IPRATROPIUM-ALBUTEROL 0.5-2.5 (3) MG/3ML IN SOLN
3.0000 mL | Freq: Once | RESPIRATORY_TRACT | Status: AC
  Administered 2023-08-25: 3 mL via RESPIRATORY_TRACT

## 2023-08-25 MED ORDER — LEVOFLOXACIN 750 MG PO TABS
750.0000 mg | ORAL_TABLET | Freq: Every day | ORAL | 0 refills | Status: AC
Start: 2023-08-25 — End: 2023-09-01

## 2023-08-25 NOTE — ED Triage Notes (Signed)
 Pt c/o SOB and headache.  Pt states that she was seen on 08/14/23 for similar symptoms  Pt was given albuterol , a nasal spray, and cough syrup  Pt states that the symptoms feel the same as her last visit.

## 2023-08-25 NOTE — Discharge Instructions (Addendum)
 I will contact you if and only if the radiology overread differs enough from mine and we need to change management, or if your kidney function requires that we change your medications.  Continue taking 2 puffs from your albuterol  inhaler using your spacer every 4-6 hours, and finish the prednisone , even if you feel better.  Finish the Levaquin , even if you feel better.  Saline nasal irrigation with a NeilMed sinus rinse and distilled water as often as you want, plain Mucinex, continue Atrovent  nasal spray.  I have referred you to both cardiology and pulmonology for evaluation of your shortness of breath.  Go immediately to the emergency department for chest pain, pressure, heaviness, worsening shortness of breath, or for other concerns.

## 2023-08-25 NOTE — ED Provider Notes (Signed)
 HPI  SUBJECTIVE:  Carly Carter is a 73 y.o. female who presents with 2 months of shortness of breath, that got worse 10 days ago but is now getting better.  She reports increased effort to breathe, but states that her O2 sat has been within normal limits.  She reports wheezing, dyspnea on exertion.  Denies cough, chest pain, palpitations, unintentional weight gain, lower extremity edema, orthopnea, nocturia, abdominal pain, PND.  She has been using her albuterol  inhaler with a spacer every 4 hours which has not helped with her shortness of breath.  Her shortness of breath is worse with physical activity, going out into the heat.  She has never had symptoms like this before.   She also reports 2 weeks of headache/sinus pain and pressure, thick postnasal drip, clear nasal congestion/rhinorrhea worse on the left, which got better with the Omnicef  and Atrovent , but returned 2 days ago.  Finish the Omnicef  no facial swelling.  No fevers.  No GERD, sore throat.  She has not not been using the promethazine  DM or Tessalon  as she has no cough.  Her sinus pain and pressure is worse with bending forward.  She has a past medical history of glaucoma and hypothyroidism, history of frequent sinusitis.  She smoked a pack a day for 36 years, quit in 2008.  No history of pulmonary disease, CHF, DVT/PE, cancer, GERD.  She was seen here 10 days ago for 2 months worth of shortness of breath, sinus pressure with clear nasal discharge and 1 month of wheezing without cough.  Chest x-ray was normal.  She was sent home with cefdinir  300 twice daily for 7 days, Atrovent  nasal spray, Tessalon  Perles, Promethazine  DM, albuterol  inhaler and spacer.    Past Medical History:  Diagnosis Date   Glaucoma    Hypothyroidism     Past Surgical History:  Procedure Laterality Date   HIP SURGERY Bilateral     History reviewed. No pertinent family history.  Social History   Tobacco Use   Smoking status: Unknown  Vaping Use    Vaping status: Never Used  Substance Use Topics   Alcohol use: Yes    Comment: occasional glass of wine per week   Drug use: Never    No current facility-administered medications for this encounter.  Current Outpatient Medications:    levofloxacin  (LEVAQUIN ) 750 MG tablet, Take 1 tablet (750 mg total) by mouth daily for 7 days., Disp: 7 tablet, Rfl: 0   levothyroxine (SYNTHROID) 100 MCG tablet, Take 100 mcg by mouth daily., Disp: , Rfl:    predniSONE  (DELTASONE ) 20 MG tablet, Take 2 tablets (40 mg total) by mouth daily with breakfast for 5 days., Disp: 10 tablet, Rfl: 0   timolol (TIMOPTIC) 0.5 % ophthalmic solution, SMARTSIG:In Eye(s), Disp: , Rfl:    albuterol  (VENTOLIN  HFA) 108 (90 Base) MCG/ACT inhaler, Inhale 2 puffs into the lungs every 4 (four) hours as needed., Disp: 18 g, Rfl: 0   ipratropium (ATROVENT ) 0.06 % nasal spray, Place 2 sprays into both nostrils 4 (four) times daily., Disp: 15 mL, Rfl: 12   Spacer/Aero-Holding Chambers (AEROCHAMBER MV) inhaler, Use as instructed, Disp: 1 each, Rfl: 2  Allergies  Allergen Reactions   Radish [Radish (Raphanus Sativus)] Anaphylaxis   Wasp Venom Swelling   Other     Patient states she is allergic to Indicin back in the 80's. I can not find this drug.   Indomethacin Other (See Comments)    Migraines  Other reaction(s): Migraine  ROS  As noted in HPI.   Physical Exam  BP 118/76 (BP Location: Left Arm)   Pulse 97   Temp 98.3 F (36.8 C) (Oral)   Resp 16   Ht 5' 4 (1.626 m)   Wt 99.8 kg   SpO2 96%   BMI 37.76 kg/m   Constitutional: Well developed, well nourished, no acute distress.  Speaking full sentences. Eyes: PERRL, EOMI, conjunctiva normal bilaterally HENT: Normocephalic, atraumatic,mucus membranes moist.  Mucoid nasal congestion.  Erythematous, swollen turbinates, worse on the left.  Nasal congestion on the left.  Positive frontal sinus tenderness left side.  No maxillary sinus tenderness bilaterally.  Normal  oropharynx.  No appreciable postnasal drip. Respiratory: Fair air movement, clear to auscultation bilaterally, no rales, no wheezing, no rhonchi.  No anterior, lateral chest wall tenderness Cardiovascular: Normal rate and rhythm, no murmurs, no gallops, no rubs GI: nondistended skin: No rash, skin intact Musculoskeletal: Calves symmetric, nontender, no edema Neurologic: Alert & oriented x 3, CN III-XII grossly intact, no motor deficits, sensation grossly intact Psychiatric: Speech and behavior appropriate   ED Course   Medications  ipratropium-albuterol  (DUONEB) 0.5-2.5 (3) MG/3ML nebulizer solution 3 mL (3 mLs Nebulization Given 08/25/23 1120)    Orders Placed This Encounter  Procedures   DG Chest 2 View    Standing Status:   Standing    Number of Occurrences:   1    Reason for Exam (SYMPTOM  OR DIAGNOSIS REQUIRED):   Shortness of breath for 2 months, not responding to bronchodilators.  Rule out pneumonia, pleural effusion, pulmonary edema, acute cardiopulmonary changes   Basic metabolic panel    Standing Status:   Standing    Number of Occurrences:   1   Ambulatory referral to Cardiology    Referral Priority:   Routine    Referral Type:   Consultation    Referral Reason:   Specialty Services Required    Number of Visits Requested:   1   Ambulatory referral to Pulmonology    Referral Priority:   Routine    Referral Type:   Consultation    Referral Reason:   Specialty Services Required    Requested Specialty:   Pulmonary Disease    Number of Visits Requested:   1   ED EKG    Give DuoNeb after EKG    Standing Status:   Standing    Number of Occurrences:   1    Reason for Exam:   Shortness of breath   EKG 12-Lead    Standing Status:   Standing    Number of Occurrences:   1   Results for orders placed or performed during the hospital encounter of 08/25/23 (from the past 24 hours)  Basic metabolic panel     Status: Abnormal   Collection Time: 08/25/23 11:36 AM  Result Value  Ref Range   Sodium 135 135 - 145 mmol/L   Potassium 4.3 3.5 - 5.1 mmol/L   Chloride 103 98 - 111 mmol/L   CO2 21 (L) 22 - 32 mmol/L   Glucose, Bld 108 (H) 70 - 99 mg/dL   BUN 14 8 - 23 mg/dL   Creatinine, Ser 9.17 0.44 - 1.00 mg/dL   Calcium 8.8 (L) 8.9 - 10.3 mg/dL   GFR, Estimated >39 >39 mL/min   Anion gap 11 5 - 15   DG Chest 2 View Result Date: 08/25/2023 CLINICAL DATA:  Shortness of breath for 2 months. EXAM: CHEST - 2 VIEW COMPARISON:  08/14/2023 FINDINGS: The heart size and mediastinal contours are within normal limits. Both lungs are clear. Small to moderate hiatal hernia is unchanged in appearance. IMPRESSION: No active cardiopulmonary disease. Stable hiatal hernia. Electronically Signed   By: Norleen DELENA Kil M.D.   On: 08/25/2023 13:08    ED Clinical Impression  1. Shortness of breath   2. Acute frontal sinusitis, recurrence not specified      ED Assessment/Plan    Previous records reviewed.  As noted in HPI  1.  Shortness of breath.  Will check an EKG, repeat x-ray.  Differential includes reactive airways, interstitial lung disease, mild CHF, although she has no signs or symptoms of fluid overload, cardiac dysfunction.  GERD, postnasal drip also on the differential.  Will try a DuoNeb and reevaluate  EKG: Normal sinus rhythm, rate 81.  Single PVC.  Q waves in V4/V5/V6.  No ST elevation or other ST-T wave changes.  No previous EKG for comparison.  Reviewed imaging independently.  No acute cardiopulmonary disease.  No changes compared to chest x-ray dated 08/14/2023. See radiology report for full details.  Calculated creatinine clearance from labs done today 99 mL/min.  On reevaluation post DuoNeb, patient states that she feels about the same.  Her lungs are still clear, with about the same air movement.  Will refer to cardiology because of the PVC/shortness of breath and also pulmonology for further workup and evaluation.  2.  Sinus pain and pressure, nasal congestion.   Suspect persistent sinusitis.  She responded partially to the Omnicef , which she finished 3 days ago.  Will send home with  Levaquin  750 mg orally twice daily per up-to-date guidelines.  Saline nasal irrigation, Mucinex, continue Atrovent  nasal spray, prednisone  40 mg for 5 days.  Discussed labs, imaging, MDM, treatment plan, and plan for follow-up with patient Discussed sn/sx that should prompt return to the ED. patient agrees with plan.   Meds ordered this encounter  Medications   ipratropium-albuterol  (DUONEB) 0.5-2.5 (3) MG/3ML nebulizer solution 3 mL   levofloxacin  (LEVAQUIN ) 750 MG tablet    Sig: Take 1 tablet (750 mg total) by mouth daily for 7 days.    Dispense:  7 tablet    Refill:  0   predniSONE  (DELTASONE ) 20 MG tablet    Sig: Take 2 tablets (40 mg total) by mouth daily with breakfast for 5 days.    Dispense:  10 tablet    Refill:  0      *This clinic note was created using Scientist, clinical (histocompatibility and immunogenetics). Therefore, there may be occasional mistakes despite careful proofreading. ?    Van Knee, MD 08/25/23 [phone]

## 2023-08-25 NOTE — Telephone Encounter (Signed)
 Patient returning call that she received to get scheduled from Ms Luke . Got patient on the phone to get her scheduled

## 2023-08-25 NOTE — Telephone Encounter (Signed)
 LVMTCB to schedule pulmonary consult.

## 2023-08-28 DIAGNOSIS — I493 Ventricular premature depolarization: Secondary | ICD-10-CM | POA: Insufficient documentation

## 2023-08-28 DIAGNOSIS — R0602 Shortness of breath: Secondary | ICD-10-CM | POA: Insufficient documentation

## 2023-08-28 DIAGNOSIS — Z87891 Personal history of nicotine dependence: Secondary | ICD-10-CM | POA: Insufficient documentation

## 2023-08-28 NOTE — Progress Notes (Signed)
 Cardiology Office Note  Date:  08/31/2023   ID:  Carly Carter, DOB 02-Dec-1950, MRN [id number]  PCP:  Juli Fess, MD   Chief Complaint  Patient presents with   New Patient (Initial Visit)    Ref by Dr. Rosina Shirts shortness of breath. Patient was at [facility] twice with one in July 2025 and Aug. 2025 with shortness of breath. Patient denies chest pain or irregular heart beats.     HPI:  Carly Carter a 73 y.o. female with past medical history of: Long history of smoking 36 years quit 2008 obesity Who presents by referral from Dr. Rosina Mam for shortness of breath, PVCs  On discussion today she reports worsening shortness of breath over the past several months Several visits to urgent care, workup unrevealing  Seen in urgent care August 14, 2023 shortness of breath sinus pressure, wheezing without cough X-ray nonacute Albuterol , did not do anything Tried nebulizer, no difference Started on antibiotics  Seen in urgent care August 25, 2023 with shortness of breath, wheezing 2 weeks of headache, sinus pain and pressure, postnasal drip, chest congestion  Sedentary, used to walk 3 miles a day last year This year (2 months ago), can't walk  Wheezing On today's visit no significant wheezing, appears comfortable  No viral symptoms,   EKG personally reviewed by myself on todays visit EKG Interpretation Date/Time:  Monday August 31 2023 15:17:57 EDT Ventricular Rate:  96 PR Interval:  148 QRS Duration:  82 QT Interval:  356 QTC Calculation: 449 R Axis:   30  Text Interpretation: Normal sinus rhythm Possible Left atrial enlargement When compared with ECG of 25-Aug-2023 11:16, Premature ventricular complexes are no longer Present Confirmed by Perla Lye ([id number]) on 08/31/2023 3:23:52 PM    PMH:   has a past medical history of Glaucoma and Hypothyroidism.  PSH:    Past Surgical History:  Procedure Laterality Date   HIP SURGERY Bilateral      Current Outpatient Medications  Medication Sig Dispense Refill   albuterol  (VENTOLIN  HFA) 108 (90 Base) MCG/ACT inhaler Inhale 2 puffs into the lungs every 4 (four) hours as needed. 18 g 0   ipratropium (ATROVENT ) 0.06 % nasal spray Place 2 sprays into both nostrils 4 (four) times daily. 15 mL 12   levofloxacin  (LEVAQUIN ) 750 MG tablet Take 1 tablet (750 mg total) by mouth daily for 7 days. 7 tablet 0   levothyroxine (SYNTHROID) 100 MCG tablet Take 100 mcg by mouth daily.     predniSONE  (DELTASONE ) 20 MG tablet Take 2 tablets (40 mg total) by mouth daily with breakfast for 5 days. 10 tablet 0   Spacer/Aero-Holding Chambers (AEROCHAMBER MV) inhaler Use as instructed 1 each 2   timolol (TIMOPTIC) 0.5 % ophthalmic solution SMARTSIG:In Eye(s)     No current facility-administered medications for this visit.   Allergies:   Radish [radish (raphanus sativus)], Wasp venom, Other, and Indomethacin   Social History:  The patient  reports that she quit smoking about 53 years ago. Her smoking use included cigarettes. She started smoking about 17 years ago. She does not have any smokeless tobacco history on file. She reports current alcohol use. She reports that she does not use drugs.   Family History:   family history includes Alzheimer's disease in her mother; Angina in her father; Diabetes in her brother; GER disease in her father; Heart attack (age of onset: 65) in her father.    Review of Systems: Review of Systems  Constitutional: Negative.  HENT: Negative.    Respiratory:  Positive for shortness of breath.   Cardiovascular: Negative.   Gastrointestinal: Negative.   Musculoskeletal: Negative.   Neurological: Negative.   Psychiatric/Behavioral: Negative.    All other systems reviewed and are negative.   PHYSICAL EXAM: VS:  BP 130/80 (BP Location: Right Arm, Patient Position: Sitting, Cuff Size: Large)   Pulse 96   Ht 5' 4.5 (1.638 m)   Wt 230 lb 8 oz (104.6 kg)   SpO2 95%   BMI  38.95 kg/m  , BMI Body mass index is 38.95 kg/m. GEN: Well nourished, well developed, in no acute distress HEENT: normal Neck: no JVD, carotid bruits, or masses Cardiac: RRR; no murmurs, rubs, or gallops,no edema  Respiratory:  clear to auscultation bilaterally, normal work of breathing GI: soft, nontender, nondistended, + BS MS: no deformity or atrophy Skin: warm and dry, no rash Neuro:  Strength and sensation are intact Psych: euthymic mood, full affect  Recent Labs: 08/25/2023: BUN 14; Creatinine, Ser 0.82; Potassium 4.3; Sodium 135    Lipid Panel No results found for: CHOL, HDL, LDLCALC, TRIG    Wt Readings from Last 3 Encounters:  08/31/23 230 lb 8 oz (104.6 kg)  08/25/23 220 lb (99.8 kg)  06/04/19 218 lb 0.6 oz (98.9 kg)     ASSESSMENT AND PLAN:  Problem List Items Addressed This Visit       Cardiology Problems   PVC's (premature ventricular contractions)   Relevant Orders   EKG 12-Lead (Completed)     Other   Shortness of breath - Primary   Relevant Orders   EKG 12-Lead (Completed)   History of smoking   Shortness of breath Likely multifactorial including morbid obesity, deconditioning, long history of smoking Unable to exclude asthma/COPD overlap Appears euvolemic EKG nonacute Appears comfortable on exam today with no significant wheezing on exam No medication changes made Echocardiogram ordered to rule out structural heart disease Scheduled to see pulmonary in several weeks time -She may benefit from respiratory/pulmonary rehab  PVCs No PVCs on today's EKG 1 lone PVC on prior EKG, likely little clinical significance -Echocardiogram pending as above  Hyperlipidemia Total cholesterol 235 LDL 147 No significant carotid calcification on CT scan 2021 -Could treat her hyperlipidemia with a statin    Signed, Velinda Lunger, M.D., Ph.D. [facility] [place], [place] [phone]

## 2023-08-31 ENCOUNTER — Ambulatory Visit: Attending: Cardiovascular Disease | Admitting: Cardiovascular Disease

## 2023-08-31 ENCOUNTER — Encounter: Payer: Self-pay | Admitting: Cardiovascular Disease

## 2023-08-31 VITALS — BP 130/80 | HR 96 | Ht 64.5 in | Wt 230.5 lb

## 2023-08-31 DIAGNOSIS — Z87891 Personal history of nicotine dependence: Secondary | ICD-10-CM | POA: Insufficient documentation

## 2023-08-31 DIAGNOSIS — R0602 Shortness of breath: Secondary | ICD-10-CM | POA: Insufficient documentation

## 2023-08-31 DIAGNOSIS — I493 Ventricular premature depolarization: Secondary | ICD-10-CM | POA: Diagnosis not present

## 2023-08-31 NOTE — Patient Instructions (Addendum)

## 2023-09-10 ENCOUNTER — Encounter: Payer: Self-pay | Admitting: Internal Medicine

## 2023-09-10 ENCOUNTER — Ambulatory Visit (INDEPENDENT_AMBULATORY_CARE_PROVIDER_SITE_OTHER): Admitting: Internal Medicine

## 2023-09-10 VITALS — BP 120/72 | HR 78 | Temp 98.0°F | Ht 64.5 in | Wt 232.2 lb

## 2023-09-10 DIAGNOSIS — R0602 Shortness of breath: Secondary | ICD-10-CM | POA: Diagnosis not present

## 2023-09-10 DIAGNOSIS — G4733 Obstructive sleep apnea (adult) (pediatric): Secondary | ICD-10-CM | POA: Diagnosis not present

## 2023-09-10 MED ORDER — FLUTICASONE-SALMETEROL 45-21 MCG/ACT IN AERO: 2.0000 | INHALATION_SPRAY | Freq: Two times a day (BID) | RESPIRATORY_TRACT | 12 refills | Status: DC

## 2023-09-10 NOTE — Progress Notes (Unsigned)
 [facility] Pulmonary Medicine Consultation      Date: 09/10/2023,   MRN# [id number] Alden Feagan 01-17-1951   CHIEF COMPLAINT:   Assessment of shortness of breath  HISTORY OF PRESENT ILLNESS   73 y.o. female with past medical history of Long history of smoking 36 years quit 2008,obesity was seen by cardiology for PVC's Assessment shortness of breath wheezing cough reports worsening shortness of breath over the past several months Several visits to urgent care, workup unrevealing Patient was given 1 round of prednisone  and antibiotics with cefdinir  fear Chest x-rays done twice at the urgent care did not show any significant issues Patient also received albuterol  nebulizer which did not help Patient with postnasal drip symptoms Extensive smoking history but quit in 2008  I have explained to patient that she may have underlying obstructive lung disease which may have been exacerbated by viral infection Patient also morbidly obese with deconditioned state which can contribute to her underlying shortness of breath Patient is ongoing cardiology evaluation as well  No exacerbation at this time No evidence of heart failure at this time No evidence or signs of infection at this time No respiratory distress No fevers, chills, nausea, vomiting, diarrhea No evidence of lower extremity edema No evidence hemoptysis   Patient is seen today for problems and issues with sleep related to excessive daytime sleepiness Patient  has been having sleep problems for many years Patient has been having excessive daytime sleepiness for a long time Patient has been having extreme fatigue and tiredness, lack of energy +  very Loud snoring every night + struggling breathe at night and gasps for air  Discussed sleep data and reviewed with patient.  Encouraged proper weight management.  Discussed driving precautions and its relationship with hypersomnolence.  Discussed operating dangerous equipment  and its relationship with hypersomnolence.  Discussed sleep hygiene, and benefits of a fixed sleep waked time.  The importance of getting eight or more hours of sleep discussed with patient.  Discussed limiting the use of the computer and television before bedtime.  Decrease naps during the day, so night time sleep will become enhanced.  Limit caffeine, and sleep deprivation.  HTN, stroke, and heart failure are potential risk factors.   Discussed risk of untreated sleep apnea including cardiac arrhthymias, stroke, DM, pulm HTN.      09/10/2023   10:00 AM  Results of the Epworth flowsheet  Sitting and reading 1  Watching TV 0  Sitting, inactive in a public place (e.g. a theatre or a meeting) 0  As a passenger in a car for an hour without a break 0  Lying down to rest in the afternoon when circumstances permit 2  Sitting and talking to someone 0  Sitting quietly after a lunch without alcohol 0  In a car, while stopped for a few minutes in traffic 0  Total score 3     PAST MEDICAL HISTORY   Past Medical History:  Diagnosis Date   Glaucoma    Hypothyroidism      SURGICAL HISTORY   Past Surgical History:  Procedure Laterality Date   HIP SURGERY Bilateral      FAMILY HISTORY   Family History  Problem Relation Age of Onset   Alzheimer's disease Mother    Heart attack Father 60   Angina Father    GER disease Father    Diabetes Brother      SOCIAL HISTORY   Social History   Tobacco Use   Smoking status: Former  Types: Cigarettes    Start date: 2008    Quit date: 1972    Years since quitting: 53.6  Vaping Use   Vaping status: Never Used  Substance Use Topics   Alcohol use: Yes    Comment: occasional glass of wine per week   Drug use: Never     MEDICATIONS    Home Medication:  Current Outpatient Rx   Order #: [id number] Class: Normal   Order #: [id number] Class: Normal   Order #: [id number] Class: Historical Med   Order #: [id number] Class: Normal    Order #: [id number] Class: Historical Med    Current Medication:  Current Outpatient Medications:    albuterol  (VENTOLIN  HFA) 108 (90 Base) MCG/ACT inhaler, Inhale 2 puffs into the lungs every 4 (four) hours as needed., Disp: 18 g, Rfl: 0   ipratropium (ATROVENT ) 0.06 % nasal spray, Place 2 sprays into both nostrils 4 (four) times daily., Disp: 15 mL, Rfl: 12   levothyroxine (SYNTHROID) 100 MCG tablet, Take 100 mcg by mouth daily., Disp: , Rfl:    Spacer/Aero-Holding Chambers (AEROCHAMBER MV) inhaler, Use as instructed, Disp: 1 each, Rfl: 2   timolol (TIMOPTIC) 0.5 % ophthalmic solution, SMARTSIG:In Eye(s), Disp: , Rfl:     ALLERGIES   Radish [radish (raphanus sativus)], Wasp venom, Other, and Indomethacin  BP 120/72   Pulse 78   Temp 98 F (36.7 C)   Ht 5' 4.5 (1.638 m)   Wt 232 lb 3.2 oz (105.3 kg)   SpO2 98%   BMI 39.24 kg/m     Review of Systems: Gen:  Denies  fever, sweats, chills weight loss  HEENT: Denies blurred vision, double vision, ear pain, eye pain, hearing loss, nose bleeds, sore throat Cardiac:  No dizziness, chest pain or heaviness, chest tightness,edema, No JVD Resp:   No cough, -sputum production, -shortness of breath,-wheezing, -hemoptysis,  Other:  All other systems negative   Physical Examination:   General Appearance: No distress  EYES PERRLA, EOM intact.   NECK Supple, No JVD Pulmonary: normal breath sounds, No wheezing.  CardiovascularNormal S1,S2.  No m/r/g.   Abdomen: Benign, Soft, non-tender. Neurology UE/LE 5/5 strength, no focal deficits Ext pulses intact, cap refill intact ALL OTHER ROS ARE NEGATIVE       IMAGING    DG Chest 2 View Result Date: 08/25/2023 CLINICAL DATA:  Shortness of breath for 2 months. EXAM: CHEST - 2 VIEW COMPARISON:  08/14/2023 FINDINGS: The heart size and mediastinal contours are within normal limits. Both lungs are clear. Small to moderate hiatal hernia is unchanged in appearance. IMPRESSION: No active  cardiopulmonary disease. Stable hiatal hernia. Electronically Signed   By: Norleen DELENA Kil M.D.   On: 08/25/2023 13:08   DG Chest 2 View Result Date: 08/14/2023 CLINICAL DATA:  Shortness of breath. EXAM: CHEST - 2 VIEW COMPARISON:  None Available. FINDINGS: No focal consolidation, pleural effusion or pneumothorax. Moderate size hiatal hernia. The cardiac silhouette is within normal limits. No acute osseous pathology. IMPRESSION: 1. No active cardiopulmonary disease. 2. Moderate size hiatal hernia. Electronically Signed   By: Vanetta Chou M.D.   On: 08/14/2023 13:18      ASSESSMENT/PLAN   73 year old pleasant white female seen today for multiple medical issues including progressive shortness of breath intermittent wheezing nonproductive cough dyspnea on exertion related to recent viral infection with possible tick bite along with probable underlying sleep apnea in the setting of morbid obesity and deconditioned state with a previous history of send extensive smoking history but quit in  2008  Assessment of shortness of breath Probable underlying obstructive lung disease probable underlying bronchiectasis Patient with a history of extensive smoking history Recommend pulmonary function testing for further evaluation Patient with increased shortness of breath probable underlying viral infection Plan for CT of the chest to assess for lung damage Start Advair HFA Continue albuterol  as needed Avoid Allergens and Irritants Avoid secondhand smoke Avoid SICK contacts Recommend  Masking  when appropriate Recommend Keep up-to-date with vaccinations   Assessment of OSA Recommend sleep study at home for further evaluation  Obesity -recommend significant weight loss -recommend changing diet  Deconditioned state -Recommend increased daily activity and exercise     MEDICATION ADJUSTMENTS/LABS AND TESTS ORDERED: Home sleep test CT chest Pulmonary function testing Start Advair Rinse mouth  after use Avoid Allergens and Irritants Avoid secondhand smoke Avoid SICK contacts Recommend  Masking  when appropriate Recommend Keep up-to-date with vaccinations    CURRENT MEDICATIONS REVIEWED AT LENGTH WITH PATIENT TODAY   Patient  satisfied with Plan of action and management. All questions answered   Follow up 3 months   I spent a total of 65 minutes dedicated to the care of this patient on the date of this encounter to include pre-visit review of records, face-to-face time with the patient discussing conditions above, post visit ordering of testing, clinical documentation with the electronic health record, making appropriate referrals as documented, and communicating necessary information to the patient's healthcare team.    The Patient requires high complexity decision making for assessment and support, frequent evaluation and titration of therapies, application of advanced monitoring technologies and extensive interpretation of multiple databases.  Patient satisfied with Plan of action and management. All questions answered    Nickolas Alm Cellar, M.D.  [facility]  Medical Director [facility] Medical Director [facility] Cardio-Pulmonary Department

## 2023-09-10 NOTE — Patient Instructions (Addendum)
 Recommend obtaining pulmonary function testing to assess lung function Recommend obtaining home sleep study to assess for sleep apnea Recommend starting Advair HFA 2 puffs in AM and 2 puffs in PM Please Rinse mouth after every use Obtain CT of the chest to assess for lung damage  Avoid Allergens and Irritants Avoid secondhand smoke Avoid SICK contacts Recommend  Masking  when appropriate Recommend Keep up-to-date with vaccinations

## 2023-09-14 ENCOUNTER — Ambulatory Visit (INDEPENDENT_AMBULATORY_CARE_PROVIDER_SITE_OTHER): Admitting: Internal Medicine

## 2023-09-14 DIAGNOSIS — R0602 Shortness of breath: Secondary | ICD-10-CM

## 2023-09-14 LAB — PULMONARY FUNCTION TEST
DL/VA % pred: 60 %
DL/VA: 2.51 ml/min/mmHg/L
DLCO unc % pred: 56 %
DLCO unc: 11.1 ml/min/mmHg
FEF 25-75 Post: 0.88 L/s
FEF 25-75 Pre: 0.76 L/s
FEF2575-%Change-Post: 16 %
FEF2575-%Pred-Post: 46 %
FEF2575-%Pred-Pre: 40 %
FEV1-%Change-Post: 4 %
FEV1-%Pred-Post: 71 %
FEV1-%Pred-Pre: 68 %
FEV1-Post: 1.64 L
FEV1-Pre: 1.57 L
FEV1FVC-%Change-Post: 3 %
FEV1FVC-%Pred-Pre: 81 %
FEV6-%Change-Post: 0 %
FEV6-%Pred-Post: 86 %
FEV6-%Pred-Pre: 86 %
FEV6-Post: 2.5 L
FEV6-Pre: 2.49 L
FEV6FVC-%Change-Post: 0 %
FEV6FVC-%Pred-Post: 102 %
FEV6FVC-%Pred-Pre: 102 %
FVC-%Change-Post: 1 %
FVC-%Pred-Post: 84 %
FVC-%Pred-Pre: 83 %
FVC-Post: 2.57 L
FVC-Pre: 2.54 L
Post FEV1/FVC ratio: 64 %
Post FEV6/FVC ratio: 98 %
Pre FEV1/FVC ratio: 62 %
Pre FEV6/FVC Ratio: 98 %
RV % pred: 134 %
RV: 3 L
TLC % pred: 110 %
TLC: 5.69 L

## 2023-09-14 NOTE — Progress Notes (Signed)
 Full PFT completed today ? ?

## 2023-09-14 NOTE — Patient Instructions (Signed)
 Full PFT completed today ? ?

## 2023-09-22 ENCOUNTER — Ambulatory Visit: Attending: Cardiovascular Disease

## 2023-09-22 DIAGNOSIS — R0602 Shortness of breath: Secondary | ICD-10-CM | POA: Diagnosis not present

## 2023-09-22 LAB — ECHOCARDIOGRAM COMPLETE
AR max vel: 2.29 cm2
AV Area VTI: 2.23 cm2
AV Area mean vel: 2.12 cm2
AV Mean grad: 4 mmHg
AV Peak grad: 6.7 mmHg
Ao pk vel: 1.29 m/s
Area-P 1/2: 2.72 cm2
Calc EF: 53.2 %
MV VTI: 2.41 cm2
S' Lateral: 2.5 cm
Single Plane A2C EF: 52.1 %
Single Plane A4C EF: 53.6 %

## 2023-09-26 ENCOUNTER — Ambulatory Visit: Payer: Self-pay | Admitting: Cardiovascular Disease

## 2023-09-28 ENCOUNTER — Encounter: Payer: Self-pay | Admitting: Emergency Medicine

## 2023-10-07 ENCOUNTER — Ambulatory Visit
Admission: RE | Admit: 2023-10-07 | Discharge: 2023-10-07 | Disposition: A | Discharge status: Home / Self Care | Source: Ambulatory Visit | Attending: Internal Medicine | Admitting: Internal Medicine

## 2023-10-07 DIAGNOSIS — R0602 Shortness of breath: Secondary | ICD-10-CM | POA: Insufficient documentation

## 2023-10-13 ENCOUNTER — Other Ambulatory Visit: Payer: Self-pay | Admitting: Internal Medicine

## 2023-10-13 ENCOUNTER — Ambulatory Visit: Payer: Self-pay

## 2023-10-13 DIAGNOSIS — R911 Solitary pulmonary nodule: Secondary | ICD-10-CM

## 2023-10-13 NOTE — Progress Notes (Signed)
 Recommend CT chest 6 months

## 2023-10-25 ENCOUNTER — Encounter

## 2023-10-25 DIAGNOSIS — G4733 Obstructive sleep apnea (adult) (pediatric): Secondary | ICD-10-CM

## 2023-11-04 DIAGNOSIS — G4733 Obstructive sleep apnea (adult) (pediatric): Secondary | ICD-10-CM | POA: Diagnosis not present

## 2023-12-24 ENCOUNTER — Ambulatory Visit: Admitting: Internal Medicine

## 2023-12-30 ENCOUNTER — Encounter: Payer: Self-pay | Admitting: Internal Medicine

## 2023-12-30 ENCOUNTER — Ambulatory Visit: Admitting: Internal Medicine

## 2023-12-30 ENCOUNTER — Other Ambulatory Visit: Payer: Self-pay | Admitting: Internal Medicine

## 2023-12-30 ENCOUNTER — Other Ambulatory Visit (HOSPITAL_COMMUNITY): Payer: Self-pay

## 2023-12-30 VITALS — BP 120/80 | HR 78 | Temp 98.2°F | Ht 64.0 in | Wt 232.6 lb

## 2023-12-30 DIAGNOSIS — R0602 Shortness of breath: Secondary | ICD-10-CM | POA: Diagnosis not present

## 2023-12-30 DIAGNOSIS — J4489 Other specified chronic obstructive pulmonary disease: Secondary | ICD-10-CM

## 2023-12-30 MED ORDER — BUDESONIDE-FORMOTEROL FUMARATE 80-4.5 MCG/ACT IN AERO: 2.0000 | INHALATION_SPRAY | Freq: Two times a day (BID) | RESPIRATORY_TRACT | 6 refills | Status: AC

## 2023-12-30 MED ORDER — BUDESONIDE-FORMOTEROL FUMARATE 160-4.5 MCG/ACT IN AERO: 2.0000 | INHALATION_SPRAY | Freq: Every day | RESPIRATORY_TRACT | 12 refills | Status: DC

## 2023-12-30 NOTE — Patient Instructions (Signed)
 Continue inhalers as prescribed Recommend repeat CT chest in 6 months Recommend obtaining overnight pulse oximetry to assess for oxygen levels at night Consider flutter valve and incentive spirometry 10-15 times per day  Avoid Allergens and Irritants Avoid secondhand smoke Avoid SICK contacts Recommend  Masking  when appropriate Recommend Keep up-to-date with vaccinations

## 2023-12-30 NOTE — Addendum Note (Signed)
 Addended by: Sanaia Jasso J on: 12/30/2023 03:50 PM   Modules accepted: Orders

## 2023-12-30 NOTE — Progress Notes (Signed)
 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  CT chest September 2025 Small pulmonary nodules  Home sleep study October 2025 AHI 22 to 29 CHIEF COMPLAINT:   Follow-up assessment moderate COPD FEV1 71% predicted Follow-up assessment OSA moderate to severe AHI 22-29   HISTORY OF PRESENT ILLNESS   Extensive smoking history but quit in 2008 Patient with moderate COPD FEV1 71% predicted Patient started on Advair however did not tolerate it patient switched to Symbicort which seems to be helping significantly Will prescribe Symbicort as maintenance therapy  No exacerbation at this time No evidence of heart failure at this time No evidence or signs of infection at this time No respiratory distress No fevers, chills, nausea, vomiting, diarrhea No evidence of lower extremity edema No evidence hemoptysis  Home sleep study reviewed in detail with patient Patient moderate to severe sleep apnea AHI 20-29 At this time patient refused CPAP therapy patient also had significant mount of hypoxia will obtain overnight pulse oximetry for further evaluation  Abnormal CT chest with subcentimeter pulmonary nodules in September 2025 Recommend follow-up CT chest in 6 months PAST MEDICAL HISTORY   Past Medical History:  Diagnosis Date   Glaucoma    Hypothyroidism      SURGICAL HISTORY   Past Surgical History:  Procedure Laterality Date   HIP SURGERY Bilateral      FAMILY HISTORY   Family History  Problem Relation Age of Onset   Alzheimer's disease  Mother    Heart attack Father 28   Angina Father    GER disease Father    Diabetes Brother      SOCIAL HISTORY   Social History   Tobacco Use   Smoking status: Former    Types: Cigarettes    Start date: 2008    Quit date: 1972    Years since quitting: 53.9   Tobacco comments:    Started smoking in 1972    Smoked 1 PPD at her heaviest.  Psychologist, Educational Use   Vaping status: Never Used  Substance Use Topics   Alcohol use: Yes    Comment: occasional glass of wine per week   Drug use: Never     MEDICATIONS    Home Medication:  Current Outpatient Rx   Order #: [id number] Class: Normal   Order #: [id number] Class: Historical Med   Order #: [id number] Class: Normal   Order #: [id number] Class: Normal   Order #: [id number] Class: Historical Med   Order #: [id number] Class: Normal   Order #: [id number] Class: Historical Med    Current Medication:  Current Outpatient Medications:    albuterol  (VENTOLIN  HFA) 108 (90 Base) MCG/ACT inhaler, Inhale 2 puffs into the lungs every 4 (four) hours as needed., Disp: 18 g, Rfl: 0   EPINEPHrine  0.3 mg/0.3 mL IJ SOAJ injection, Inject 0.3 mg into the muscle as needed for anaphylaxis., Disp: , Rfl:    fluticasone -salmeterol (ADVAIR HFA) 45-21 MCG/ACT inhaler, Inhale 2 puffs into the lungs 2 (two) times daily., Disp: 1 each, Rfl: 12   ipratropium (ATROVENT ) 0.06 % nasal spray, Place 2 sprays into both nostrils 4 (four) times daily., Disp: 15 mL, Rfl: 12  levothyroxine (SYNTHROID) 100 MCG tablet, Take 100 mcg by mouth daily., Disp: , Rfl:    Spacer/Aero-Holding Chambers (AEROCHAMBER MV) inhaler, Use as instructed, Disp: 1 each, Rfl: 2   timolol (TIMOPTIC) 0.5 % ophthalmic solution, SMARTSIG:In Eye(s), Disp: , Rfl:     ALLERGIES   Radish [radish (raphanus sativus)], Wasp venom, Other, and Indomethacin  BP 120/80   Pulse 78   Temp 98.2 F (36.8 C)   Ht 5' 4 (1.626 m)   Wt 232 lb 9.6 oz (105.5 kg)   SpO2 99%   BMI 39.93 kg/m   Alert awake following  commands Clear to auscultation no wheezing S1-S2 no murmurs Pulses intact No focal deficit        ASSESSMENT/PLAN   73 year old pleasant white female seen today for multiple medical issues including progressive shortness of breath intermittent wheezing nonproductive cough dyspnea on exertion related to recent viral infection with possible tick bite along with probable underlying sleep apnea in the setting of morbid obesity and deconditioned state with a previous history of send extensive smoking history but quit in 2008  Assessment of shortness of breath Patient with moderate COPD Continue Symbicort as prescribed Rinse mouth after use Continue albuterol  as needed Avoid Allergens and Irritants Avoid secondhand smoke Avoid SICK contacts Recommend  Masking  when appropriate Recommend Keep up-to-date with vaccinations   Assessment of OSA Moderate severe AHI 20-29 Recommend CPAP however patient refused at this time   Shortness of breath dyspnea on exertion Need to assess for nocturnal hypoxia Check overnight pulse oximetry   CT chest September 2025 small pulmonary nodules noted Repeat CT scan in 6 months   MEDICATION ADJUSTMENTS/LABS AND TESTS ORDERED: Overnight pulse oximetry CT chest 6 months Continue Symbicort    CURRENT MEDICATIONS REVIEWED AT LENGTH WITH PATIENT TODAY   Patient  satisfied with Plan of action and management. All questions answered   Follow up 6 months   I spent a total of 55 minutes dedicated to the care of this patient on the date of this encounter to include pre-visit review of records, face-to-face time with the patient discussing conditions above, post visit ordering of testing, clinical documentation with the electronic health record, making appropriate referrals as documented, and communicating necessary information to the patient's healthcare team.    The Patient requires high complexity decision making for assessment and support,  frequent evaluation and titration of therapies, application of advanced monitoring technologies and extensive interpretation of multiple databases.  Patient satisfied with Plan of action and management. All questions answered    Nickolas Alm Cellar, M.D.  [facility]monary & Critical Care Medicine  Medical Director [facility]

## 2024-01-08 ENCOUNTER — Telehealth: Payer: Self-pay

## 2024-01-08 DIAGNOSIS — J4489 Other specified chronic obstructive pulmonary disease: Secondary | ICD-10-CM

## 2024-01-08 DIAGNOSIS — G4734 Idiopathic sleep related nonobstructive alveolar hypoventilation: Secondary | ICD-10-CM

## 2024-01-08 NOTE — Telephone Encounter (Signed)
 Copied from CRM #[id number]. Topic: Clinical - Lab/Test Results >> Jan 08, 2024 12:59 PM Carly Carter wrote: Reason for CRM: Patient is returning a call from [facility] regarding her ONO results. Please call patient back to review when available. Patient is having phone issues, she advised that her phone hasn't been ringing but is showing missed calls. If she doesn't answer she will do her best to call back ASAP.

## 2024-01-08 NOTE — Telephone Encounter (Signed)
 Dr. Isaiah had reviewed her ONO results and determined she would need 2L of oxygen at night only. Needing to know if pt is agreeable with starting this therapy.

## 2024-01-08 NOTE — Telephone Encounter (Signed)
 Spoke with pt to relay results and she is agreeable to starting therapy. Oxygen order has been placed.

## 2024-01-12 NOTE — Telephone Encounter (Signed)
 02 order was sent to [facility]

## 2024-03-30 ENCOUNTER — Ambulatory Visit
